# Patient Record
Sex: Female | Born: 1970
Health system: Southern US, Community
[De-identification: ages and names within clinical notes are randomized; demographics above are authoritative.]

## PROBLEM LIST (undated history)

## (undated) DIAGNOSIS — F329 Major depressive disorder, single episode, unspecified: Secondary | ICD-10-CM

## (undated) DIAGNOSIS — N1370 Vesicoureteral-reflux, unspecified: Secondary | ICD-10-CM

## (undated) DIAGNOSIS — M545 Low back pain, unspecified: Secondary | ICD-10-CM

## (undated) DIAGNOSIS — L309 Dermatitis, unspecified: Secondary | ICD-10-CM

## (undated) DIAGNOSIS — F419 Anxiety disorder, unspecified: Secondary | ICD-10-CM

## (undated) DIAGNOSIS — M1990 Unspecified osteoarthritis, unspecified site: Secondary | ICD-10-CM

## (undated) DIAGNOSIS — F32A Depression, unspecified: Secondary | ICD-10-CM

## (undated) DIAGNOSIS — H905 Unspecified sensorineural hearing loss: Secondary | ICD-10-CM

## (undated) HISTORY — DX: Low back pain, unspecified: M54.50

## (undated) HISTORY — DX: Depression, unspecified: F32.A

## (undated) HISTORY — DX: Vesicoureteral-reflux, unspecified: N13.70

## (undated) HISTORY — DX: Major depressive disorder, single episode, unspecified: F32.9

## (undated) HISTORY — DX: Unspecified sensorineural hearing loss: H90.5

## (undated) HISTORY — DX: Dermatitis, unspecified: L30.9

## (undated) HISTORY — DX: Anxiety disorder, unspecified: F41.9

## (undated) HISTORY — DX: Low back pain: M54.5

---

## 1990-05-18 HISTORY — PX: OTHER SURGICAL HISTORY: SHX169

## 2000-03-20 ENCOUNTER — Other Ambulatory Visit: Admission: RE | Admit: 2000-03-20 | Discharge: 2000-03-20 | Payer: Self-pay | Admitting: Gynecology

## 2000-10-17 ENCOUNTER — Other Ambulatory Visit: Admission: RE | Admit: 2000-10-17 | Discharge: 2000-10-17 | Payer: Self-pay | Admitting: Gynecology

## 2000-12-17 ENCOUNTER — Encounter: Admission: RE | Admit: 2000-12-17 | Discharge: 2000-12-17 | Payer: Self-pay | Admitting: Sports Medicine

## 2001-04-23 ENCOUNTER — Other Ambulatory Visit: Admission: RE | Admit: 2001-04-23 | Discharge: 2001-04-23 | Payer: Self-pay | Admitting: Gynecology

## 2001-08-22 ENCOUNTER — Encounter: Admission: RE | Admit: 2001-08-22 | Discharge: 2001-08-22 | Payer: Self-pay | Admitting: Sports Medicine

## 2002-04-16 ENCOUNTER — Other Ambulatory Visit: Admission: RE | Admit: 2002-04-16 | Discharge: 2002-04-16 | Payer: Self-pay | Admitting: Gynecology

## 2002-09-30 ENCOUNTER — Encounter: Payer: Self-pay | Admitting: Obstetrics and Gynecology

## 2002-09-30 ENCOUNTER — Ambulatory Visit (HOSPITAL_COMMUNITY): Admission: RE | Admit: 2002-09-30 | Discharge: 2002-09-30 | Payer: Self-pay | Admitting: Obstetrics and Gynecology

## 2002-12-24 ENCOUNTER — Inpatient Hospital Stay: Admission: AD | Admit: 2002-12-24 | Discharge: 2002-12-25 | Payer: Self-pay | Admitting: Obstetrics and Gynecology

## 2002-12-30 ENCOUNTER — Inpatient Hospital Stay (HOSPITAL_COMMUNITY): Admission: AD | Admit: 2002-12-30 | Discharge: 2002-12-30 | Payer: Self-pay | Admitting: Obstetrics and Gynecology

## 2002-12-31 ENCOUNTER — Inpatient Hospital Stay (HOSPITAL_COMMUNITY): Admission: AD | Admit: 2002-12-31 | Discharge: 2002-12-31 | Payer: Self-pay | Admitting: Obstetrics and Gynecology

## 2003-02-09 ENCOUNTER — Inpatient Hospital Stay (HOSPITAL_COMMUNITY): Admission: AD | Admit: 2003-02-09 | Discharge: 2003-02-13 | Payer: Self-pay | Admitting: Obstetrics and Gynecology

## 2003-02-10 ENCOUNTER — Encounter (INDEPENDENT_AMBULATORY_CARE_PROVIDER_SITE_OTHER): Payer: Self-pay

## 2004-07-27 ENCOUNTER — Other Ambulatory Visit: Admission: RE | Admit: 2004-07-27 | Discharge: 2004-07-27 | Payer: Self-pay | Admitting: Obstetrics and Gynecology

## 2005-06-21 ENCOUNTER — Encounter: Admission: RE | Admit: 2005-06-21 | Discharge: 2005-09-19 | Payer: Self-pay | Admitting: Internal Medicine

## 2005-08-02 ENCOUNTER — Encounter: Admission: RE | Admit: 2005-08-02 | Discharge: 2005-08-02 | Payer: Self-pay | Admitting: Internal Medicine

## 2005-09-04 ENCOUNTER — Other Ambulatory Visit: Admission: RE | Admit: 2005-09-04 | Discharge: 2005-09-04 | Payer: Self-pay | Admitting: Obstetrics and Gynecology

## 2005-11-27 ENCOUNTER — Ambulatory Visit (HOSPITAL_COMMUNITY): Admission: RE | Admit: 2005-11-27 | Discharge: 2005-11-27 | Payer: Self-pay | Admitting: Obstetrics and Gynecology

## 2005-12-20 ENCOUNTER — Ambulatory Visit (HOSPITAL_COMMUNITY): Admission: RE | Admit: 2005-12-20 | Discharge: 2005-12-20 | Payer: Self-pay | Admitting: Obstetrics and Gynecology

## 2005-12-20 ENCOUNTER — Encounter (INDEPENDENT_AMBULATORY_CARE_PROVIDER_SITE_OTHER): Payer: Self-pay | Admitting: Specialist

## 2007-01-25 ENCOUNTER — Inpatient Hospital Stay (HOSPITAL_COMMUNITY): Admission: AD | Admit: 2007-01-25 | Discharge: 2007-01-26 | Payer: Self-pay | Admitting: Gynecology

## 2008-06-23 ENCOUNTER — Inpatient Hospital Stay (HOSPITAL_COMMUNITY): Admission: AD | Admit: 2008-06-23 | Discharge: 2008-06-25 | Payer: Self-pay | Admitting: Obstetrics and Gynecology

## 2009-06-10 ENCOUNTER — Emergency Department (HOSPITAL_COMMUNITY): Admission: EM | Admit: 2009-06-10 | Discharge: 2009-06-10 | Payer: Self-pay | Admitting: Emergency Medicine

## 2010-10-02 LAB — CBC
HCT: 27 % — ABNORMAL LOW (ref 36.0–46.0)
Hemoglobin: 9.3 g/dL — ABNORMAL LOW (ref 12.0–15.0)
MCHC: 34.5 g/dL (ref 30.0–36.0)
RDW: 12.6 % (ref 11.5–15.5)

## 2010-10-30 ENCOUNTER — Inpatient Hospital Stay (INDEPENDENT_AMBULATORY_CARE_PROVIDER_SITE_OTHER)
Admission: RE | Admit: 2010-10-30 | Discharge: 2010-10-30 | Disposition: A | Discharge status: Home / Self Care | Payer: 59 | Source: Ambulatory Visit | Attending: Emergency Medicine | Admitting: Emergency Medicine

## 2010-10-30 DIAGNOSIS — J019 Acute sinusitis, unspecified: Secondary | ICD-10-CM

## 2010-10-31 NOTE — Discharge Summary (Signed)
Sheila Obrien, Sheila Obrien                ACCOUNT NO.:  [id number]   MEDICAL RECORD NO.:  [id number]          PATIENT TYPE:  INP   LOCATION:  9120                          [facility]ILITY:  [facility]   PHYSICIAN:  Sherron Monday, MD        DATE OF BIRTH:  [birth year]   DATE OF ADMISSION:  06/23/2008  DATE OF DISCHARGE:  06/25/2008                               DISCHARGE SUMMARY   ADMITTING DIAGNOSIS:  Intrauterine pregnancy at term.   DISCHARGE DIAGNOSIS:  Intrauterine pregnancy at term, delivered via  spontaneous vaginal delivery.   PROCEDURE:  Term vaginal delivery.   HISTORY OF PRESENT ILLNESS:  A 40 year old G-3, P-1-0-1-1 at 38 weeks in  active labor.  She states she has had good fetal movements, no loss of  fluids, no vaginal bleeding, and contractions since 4.30 a.m. and they  have increased in intensity and frequency since then.  She has had some  nausea and vomiting throughout her pregnancy helped with Zofran.  She  desired a VBAC after discussing in the office risks, benefits, and  alternatives.  This was again revisited, and she wished to proceed, as  she was in very active labor.   PAST MEDICAL HISTORY:  Significant for anxiety and depression and  history of thyroid disease.   PAST SURGICAL HISTORY:  Significant for low transverse caesarean section  as well as an inguinal hernia repair as well as bilateral ureteral  implants.   PAST OB/GYN HISTORY:  G1 was a low transverse caesarean section, 6-pound  infant secondary to fetal stress at 37 weeks.  G2 was a miscarriage.  G3 is the present pregnancy.  She has had a  history of an abnormal Pap smear with colposcopy.  No history of any  sexually transmitted diseases.   MEDICATIONS:  Include Prozac and Wellbutrin as well as prenatal  vitamins.   ALLERGIES:  To PENICILLIN which causes hives.   SOCIAL HISTORY:  She denies alcohol, tobacco, or drug use.  She is  married.   FAMILY HISTORY:  Significant for her father with hypertension and  maternal great uncle with colon cancer and maternal great aunt and uncle  with depression.   PRENATAL LABS:  Hemoglobin 13.1 and platelets 189,000.  AB positive.  Antibody screen negative.  Gonorrhea negative.  Chlamydia negative.  RPR  nonreactive.  Rubella immune.  Cystic fibrosis screen negative.  Hepatitis C surface antigen negative.  HIV negative.  Psoriasis skin  within normal limits. AFB within normal limits.  Group B strep was  negative.  Glucola of 80.   PHYSICAL EXAMINATION ON ADMISSION:  On admission, was afebrile. Vital  signs are stable and a benign exam.  She was in 130s and reactive on  Labor and Delivery with a decel in the MAU are likely secondary to rapid  descent.  In prenatal care an ultrasound at 11 weeks revealed normal  nuchal fold.  Ultrasound on February 09, 2008, at 18 weeks revealed normal  anatomy except an EIF, marginal previa, and appropriate growth.  The  patient was admitted to the Labor and Delivery and was GBS  negative and  needed no prophylaxis.  We expected a rapidly progressing and  delivering.  She had artificial rupture of membranes with clear fluid  without difficulty or complication and rapidly progressed to complete,  complete +2, pushed for three contractions to deliver a viable female  infant at 8:50 a.m. with Apgars of 9 at one minute and 9 at five  minutes, and a weight of 6 pounds 10 ounces.  The baby delivered LOA to  LOT, OB laceration, second-degree perineal repaired with 3-0 Vicryl  bilateral periurethral, they were hemostatic.  Estimated blood loss was  less than 500 mL.   We discussed this with the patient and her husband circumcision for female  infant including the risks, benefits, and alternatives and they wished  to proceed.  Her postpartum course was relatively uncomplicated without  significant complaint.  She had normal lochia, and her pain was well  controlled.  Her hemoglobin was 9.3 after delivery.  She was ready to be   discharged on postpartum day 2, ambulating well, and tolerating diet and  breast feeding her child without difficulty.  She was discharged to home  at this time.  She was given discharge instructions and routine numbers  to call with any questions or problems as well as prescription for  Motrin, Vicodin, and prenatal vitamins.  She voiced understanding of  this.  She is AB positive, rubella immune, and breast feeding.  Her  hemoglobin is 9.3, and we will start Micronor at her 6-week checkup.      Sherron Monday, MD  Electronically Signed     JB/MEDQ  D:  06/25/2008  T:  06/25/2008  Job:  [id number]

## 2010-11-03 NOTE — Op Note (Signed)
NAMESHARDA, Sheila Obrien                ACCOUNT NO.:  [id number]   MEDICAL RECORD NO.:  [id number]          PATIENT TYPE:  AMB   LOCATION:  SDC                           [facility]  [facility]   PHYSICIAN:  Leighton Roach Meisinger, M.D.DATE OF BIRTH:  1971-03-08   DATE OF PROCEDURE:  12/20/2005  DATE OF DISCHARGE:                                 OPERATIVE REPORT   PREOPERATIVE DIAGNOSIS:  Endometrial lesion by hysterosalpingogram.   POSTOPERATIVE DIAGNOSIS:  Endometrial lesion by hysterosalpingogram.   PROCEDURES:  Hysteroscopy with dilatation and curettage.   SURGEON:  Lavina Hamman, M.D.   ANESTHESIA:  General with a mask and paracervical block.   SPECIMENS:  Endometrial curettings sent to Pathology.   ESTIMATED BLOOD LOSS:  Minimal.   COMPLICATIONS:  None.   FINDINGS:  She had a normal endometrial cavity.  Fluid deficit through the  hysteroscope was approximately 10 mL.   PROCEDURE IN DETAIL:  The patient was taken to the operating room and placed  in the dorsal supine position.  General anesthesia was induced and she was  placed in mobile stirrups.  Perineum and vagina were then prepped and draped  in the usual sterile fashion and bladder drained with a red rubber catheter.  A Graves speculum was inserted into the vagina and 2 mL of 2% lidocaine  infiltrated at 12 o'clock.  The anterior lip of the cervix was then grasped  with a single-tooth tenaculum.  Paracervical block was then performed with  16 mL of 2% plain lidocaine.  The uterus then sounded to 8 cm and the cervix  was easily dilated to a size 19 dilator.  The observer hysteroscope was  inserted and fair visualization was achieved.  We did need to increase the  pressure up to 100 mmHg to get adequate uterine distension.  No significant  endometrial lesions were seen.  There was a fair amount of fluffy  endometrium.  Again, with fair visualization, no lesions were seen.  The  hysteroscope was then removed and gentle curettage was  performed with return  of minimal tissue.  The single-tooth tenaculum was then removed and bleeding  controlled with pressure.  All instruments were then removed from the  vagina.  The patient tolerated the procedure well and was taken to the  recovery room in stable condition.      Zenaida Niece, M.D.  Electronically Signed    TDM/MEDQ  D:  12/20/2005  T:  12/20/2005  Job:  [id number]

## 2010-11-03 NOTE — Op Note (Signed)
NAME:  Sheila Obrien, Sheila Obrien                          ACCOUNT NO.:  [id number]   MEDICAL RECORD NO.:  [id number]                   PATIENT TYPE:  INP   LOCATION:  9371                                 [facility]   PHYSICIAN:  Malachi Pro. Ambrose Mantle, M.D.              DATE OF BIRTH:  [birth year]   DATE OF PROCEDURE:  02/09/2003  DATE OF DISCHARGE:                                 OPERATIVE REPORT   HISTORY OF PRESENT ILLNESS:  This 40 year old white married female, para 0,  gravida 1, last period May 26, 2002, [facility] March 04, 2003, by dates  and March 02, 2003, by ultrasound admitted with premature rupture of  membranes.  Blood group and type AB positive, negative antibody, RPR  nonreactive, rubella immune, TSH 1.711, hepatitis B surface antigen  negative, HIV negative, GC and Chlamydia negative, triple screen normal, one  hour Glucola 115, group B strep negative.  Vaginal ultrasound on July 29, 2002, crown rump length of 2.52 cm, nine weeks' one day, [facility] March 02, 2003.  Repeat ultrasound September 30, 2002, average gestational age [redacted]  weeks' 5 days', [facility] February 26, 2003.  On December 24, 2002, the patient had  vaginal bleeding.  She was given Terbutaline.  The cervix fingertip 40% on  December 30, 2002.  On February 04, 2003, the cervix was 2 cm, 60%, vertex at a -  2.  She had spontaneous rupture of membranes on the day of admission at 3  a.m.  She came to maternity admission and rupture of membranes was confirmed  by Gwenyth Bender, RN.  On February 04, 2003, the weight had increased 7.5 pounds  in one week.  On February 08, 2003, the weight had decreased 3 pounds.   PAST MEDICAL HISTORY:  Revealed allergy to PENICILLIN caused hives and  itching.  Illnesses - hypothyroidism as a teenager, anxiety, and depression.   PAST SURGICAL HISTORY:  1. Bilateral ureteral implants, 1990.  2. Left inguinal herniorrhaphy at age six months.   FAMILY HISTORY:  Father with high blood pressure and  depression.  Sister  with migraines.   PHYSICAL EXAMINATION:  VITAL SIGNS:  Normal on admission.  HEENT:  Normal.  HEART:  Normal size and sounds.  No murmurs.  LUNGS:  Clear to P&A.  ABDOMEN:  Soft.  Fundal height 36 cm on February 04, 2003.  Fetal heart tones  were normal.  Cervix 3 cm, 50% by the RN in the MAU.  Rupture of membranes  was confirmed.   The patient was placed on Pitocin.  She reached 4 cm and received an  epidural at approximately 12:45 p.m.  At approximately 12:50 p.m. fetal  heart rate decelerations occurred.  She was given ephedrine, position was  changed, and Pitocin was stopped.  The cervix was 9 cm, 100%, vertex at a 0  station.  The fetal heart rate decelerated again at approximately  1:38 p.m.  It persisted in the 80-90 range for three to four minutes despite of  position change and oxygen.  The cervix was 9+ cm, vertex at a 0 station,  scalp electrode was applied.  Later, the fetal heart rate remained below 100  and we proceeded to the operating room to consider a vaginal instrumental  delivery or a C-  section, depending upon the stability of the heart rate.  We got to the  operating room.  The heart rate was in the 70s, 80s, and 90s, and then the  patient had a seizure and thereafter, the fetal heart rate came above 100.  We proceeded with the C-section.                                               Malachi Pro. Ambrose Mantle, M.D.    TFH/MEDQ  D:  02/09/2003  T:  02/09/2003  Job:  [id number]

## 2010-11-03 NOTE — Op Note (Signed)
NAME:  Sheila Obrien, Sheila Obrien                          ACCOUNT NO.:  [id number]   MEDICAL RECORD NO.:  [id number]                   PATIENT TYPE:  INP   LOCATION:  9371                                 [facility]   PHYSICIAN:  Malachi Pro. Ambrose Mantle, M.D.              DATE OF BIRTH:  [birth year]   DATE OF PROCEDURE:  02/09/2003  DATE OF DISCHARGE:                                 OPERATIVE REPORT   PREOPERATIVE DIAGNOSES:  1. Intrauterine pregnancy at 37 weeks'.  2. Fetal bradycardia.  3. Maternal seizure.   POSTOPERATIVE DIAGNOSES:  1. Intrauterine pregnancy at 37 weeks'.  2. Fetal bradycardia.  3. Maternal seizure.   PROCEDURE:  Low transverse cervical cesarean section.   SURGEON:  Malachi Pro. Ambrose Mantle, M.D.   ANESTHESIA:  General.   DESCRIPTION OF PROCEDURE:  The patient was brought to the operating room  because of persistent bradycardia, the fetal heart rate below 100, usually  in the 80-90 range.  She was taken straight to the operating room and after  being placed on the table the patient developed a seizure.  This was  witnessed by the nurse anesthetist and the anesthesiologist.  I was only  able to see her right hand was in carpal pedal spasm.  According to the  nurse anesthetist, the patient said I don't feel well and began to have her  eyes roll back in her head and began to have tonic movements of her neck and  head.  At this point Raul Del, M.D. felt it was best to induce  general anesthesia.  I had planned to see if the fetal heart rate would  tolerate it to consider a vaginal delivery with either forceps or vacuum but  this was ruled out in this current situation.  The patient was given general  anesthesia and was intubated.  A transverse incision was made and carried in  layers through the skin and subcutaneous tissue and fascia.  The fascia was  separated from the rectus muscle superiorly and inferiorly.  The peritoneum  was opened and stretched.  The lower uterine  segment was exposed.  I made an  incision directly into the uterus, extended the incision laterally with the  bandage scissors, reached down into the pelvis, drew the vertex out through  the incisional opening, suctioned the baby's nose and pharynx with the bulb.  The cord was wrapped around the neck and one of the extremities.  The cord  was clamped.  The infant was given to Dr. Eric Form who was in attendance.  He  assigned the female infant Apgars of 5 at one and 8 at five minutes.  I took  a venous cord pH and the result was 7.27, I think.  It was 7.2+ and I think  it was 7.27.  The placenta was removed.  The inside of the uterus was  inspected and found to be free  of any products of conception.  The uterus,  tubes, and ovaries appeared normal.  The uterine incision was closed with  two running sutures of 0 Vicryl, locking the first layer and nonlocking on  the second layer.  Liberal irrigation confirmed hemostasis.  I did not  reperitonealize the parietal or the visceral peritoneum.  I closed the  rectus muscle with interrupted 0 Vicryl.  I closed the fascia with two  running sutures of 0 Vicryl, the subcutaneous with a running 3-0 Vicryl, and  the skin was closed with automatic staples.  The patient seemed to tolerate  the procedure well.  I spoke to Dr. Cherene Altes.  He does not feel like the  patient needs any kind of neurologic workup.  He thinks that the  seizure came as a result of intravascular lidocaine.  We will further  evaluate this as time goes by.  Blood loss for the procedure was about 1000  mL.  Sponge, instrument, and needle counts were correct and she was returned  to recovery in satisfactory condition.                                               Malachi Pro. Ambrose Mantle, M.D.    TFH/MEDQ  D:  02/09/2003  T:  02/09/2003  Job:  [id number]

## 2010-11-03 NOTE — H&P (Signed)
NAME:  GILBERTO, STRECK NO.:  [id number]   MEDICAL RECORD NO.:  [id number]           PATIENT TYPE:  AMB   LOCATION:                                [facility]   PHYSICIAN:  Zenaida Niece, M.D.     DATE OF BIRTH:   DATE OF ADMISSION:  12/20/2005  DATE OF DISCHARGE:                                HISTORY & PHYSICAL   CHIEF COMPLAINT:  Endometrial lesion.   HISTORY OF PRESENT ILLNESS:  This is a 40 year old white female para 1-0-0-1  whom I saw for an annual exam in March of this year. She was having regular  periods which have been slightly heavier while taking Clomid for secondary  infertility. She took three cycles of Clomid using estrogen and progesterone  also without conception. Her husband had a semen analysis done on June 8  which is normal. The patient had a hysterosalpingogram on June 12th which  revealed a fixed filling defect in the lower uterine segment possibly  representing a polyp or submucosal fibroid. Both Fallopian tubes were  patent. Due to her secondary infertility and an endometrial lesion by  hysterosalpingogram, she is being admitted for hysteroscopy with D&C and  possible resection.   PAST OBSTETRIC HISTORY:  Significant for Cesarean section at term without  complications.   PAST MEDICAL HISTORY:  Significant for anxiety and depression.   PAST SURGICAL HISTORY:  Significant for the above mentioned Cesarean  section. She has also had bilateral ureteral implants via laparotomy for  recurrent UTI and has had a left inguinal herniorrhaphy.   ALLERGIES:  PENICILLIN which causes itching and hives.   CURRENT MEDICATIONS:  Wellbutrin.   FAMILY HISTORY:  No close GYN or colon cancer.   REVIEW OF SYSTEMS:  Normal bowel and bladder function.   SOCIAL HISTORY:  She is married and denies alcohol, tobacco or drug use.   PHYSICAL EXAMINATION:  VITAL SIGNS:  Weight 162 pounds, blood pressure  128/90, pulse 80.  GENERAL:  This is a  well-developed white female in no acute distress.  NECK:  Supple without lymphadenopathy or thyromegaly.  LUNGS:  Clear to auscultation.  HEART:  Regular rate and rhythm without murmur.  ABDOMEN:  Soft, nontender, nondistended without palpable masses. She does  have a well-healed transverse scar.  EXTREMITIES:  Have no edema and are nontender.  PELVIC:  External genitalia have no lesions. On speculum exam the cervix is  normal, and Pap smear has returned to normal. On bimanual exam she has a  small midplane nontender uterus and no adnexal masses.   ASSESSMENT:  Endometrial lesion by hysterosalpingogram during evaluation for  secondary infertility. Options have been discussed with the patient, and she  wishes to proceed with hysteroscopy with evaluation of and resection of this  lesion. Risks of surgery have been discussed.   PLAN:  To admit the patient on the day of surgery for a hysteroscopy with  possible resection or possible D&C.      Zenaida Niece, M.D.  Electronically Signed     TDM/MEDQ  D:  12/18/2005  T:  12/18/2005  Job:  [id number]

## 2010-11-03 NOTE — Discharge Summary (Signed)
NAME:  Sheila Obrien, Sheila Obrien                          ACCOUNT NO.:  [id number]   MEDICAL RECORD NO.:  [id number]                   PATIENT TYPE:  INP   LOCATION:  9120                                 [facility]   PHYSICIAN:  Malachi Pro. Ambrose Mantle, M.D.              DATE OF BIRTH:  21-Aug-1970   DATE OF ADMISSION:  02/09/2003  DATE OF DISCHARGE:  02/13/2003                                 DISCHARGE SUMMARY   DISCHARGE SUMMARY:  A 40 year old, white, married female, para 0, gravida 1,  EDC March 04, 2003 by dates and March 02, 2003 by ultrasound,  admitted with premature rupture of the membranes.  Blood group and type AB  positive.  Negative antibody.  RPR nonreactive.  Rubella immune.  Hepatitis  B surface antigen negative.  HIV negative.  GC and Chlamydia negative.  Triple screen normal.  One hour Glucola 115.  Group B strep negative.  The  patient's prenatal course is outlined in the history and physical.  She came  to the hospital with premature rupture of the membranes.  She reached 4-cm  dilatation and received an epidural at approximately 12:45 p.m. at 12:50  p.m. fetal heart decelerations were noted.  She was given ephedrine,  position change and Pitocin was stopped.  By 1 p.m. the cervix was 9-cm.  The fetal heart rate decelerated again at 1:30 p.m. or so.  It persisted in  the 80-90 range for 3-4 minutes in spite of position change and oxygen.  The  cervix was still 9+-cm, vertex at a 0 station.  Scalp electrode was applied.  Subsequently the fetal heart rate remained below 100.  I wanted to go to the  operating room to manage the final part of labor there and be ready for a C-  section.  She was taken to the operating room and was given a bolus into the  epidural and almost immediately began to have seizure.  Therefore, she was  placed under general anesthesia, intubated and a low-transverse cervical C-  section was done with delivery of a 6 pound 0 ounce female infant.  Apgar's  of  5 at one and 8 at five minutes.   Postpartum the patient did quite well.  She had no more seizure activity.  According to the anesthesiologist and the anesthetist, the seizure came as a  result of Xylocaine injected through the epidural and it must have been  intravascular.  PIH labs were drawn and the patient was treated with  magnesium sulfate for close to 24 hours but never showed any signs of pre-  eclampsia and the labs were normal.  On the fourth post-op day she was  ambulating well, voiding well without difficulty, passing flatus.  Her  abdomen was soft and nontender.  Staples removed.  Strips were applied.  And, she was discharged.   LABORATORY DATA:  Urinalysis was negative.  Hemoglobin 12, hematocrit  34.7,  white count 22,000, platelet count 175,000.  PIH labs are not in the chart,  but they were normal.   FINAL DIAGNOSES:  1. Intrauterine pregnancy at 37 weeks.  2. Premature rupture of the membranes.  3. Maternal seizure.  4. Fetal stress manifested by fetal bradycardia.   OPERATION:  Low-transverse cervical C-section.   FINAL CONDITION:  Improved.   INSTRUCTIONS:  1. Include our regular discharge instruction booklet.  2.     Patient is given a prescription for Percocet 5/325, 20 tablets, one every     four to six hours as needed for pain.  3. She also is going to continue her Wellbutrin and Lexapro.  4. Return to the office in ten days for followup examination.                                               Malachi Pro. Ambrose Mantle, M.D.    TFH/MEDQ  D:  02/13/2003  T:  02/13/2003  Job:  [id number]

## 2011-12-04 ENCOUNTER — Encounter: Payer: Self-pay | Admitting: Sports Medicine

## 2011-12-04 ENCOUNTER — Ambulatory Visit (INDEPENDENT_AMBULATORY_CARE_PROVIDER_SITE_OTHER): Payer: 59 | Admitting: Sports Medicine

## 2011-12-04 VITALS — BP 108/71 | HR 61 | Ht 64.0 in | Wt 149.0 lb

## 2011-12-04 DIAGNOSIS — M7741 Metatarsalgia, right foot: Secondary | ICD-10-CM

## 2011-12-04 DIAGNOSIS — M79671 Pain in right foot: Secondary | ICD-10-CM

## 2011-12-04 DIAGNOSIS — M7742 Metatarsalgia, left foot: Secondary | ICD-10-CM | POA: Insufficient documentation

## 2011-12-04 DIAGNOSIS — M79609 Pain in unspecified limb: Secondary | ICD-10-CM

## 2011-12-04 DIAGNOSIS — M7750 Other enthesopathy of unspecified foot: Secondary | ICD-10-CM

## 2011-12-04 NOTE — Assessment & Plan Note (Signed)
She was placed in a sports insole and given an arch strap to use for standing or walking. We added some heel lifts bilaterally to help compensate for the cavus foot  She should do some standard plantar fascial stretches and exercises along with icing at the end of the day.  After placing this she was able to run without limp and showed good running form.  I would like to recheck this in 6 weeks and repeat the ultrasound to see if the fluid has resolved.

## 2011-12-04 NOTE — Progress Notes (Signed)
  Subjective:    Patient ID: Sheila Obrien, female    DOB: [birth year], 41 y.o.   MRN: [id number]  HPI  Pt presents to clinic for evaluation of bilat heel pain R > L that started 6 weeks ago after doing a trail run.  Pain is worse after running and the next day or if she does not wear shoes.   Was last seen here for metatarsalgia about 10 years and has been using metatarsal cookies since then which have helped her return to running in the past year.   There is no prolonged morning pain. She does feel some stiffness and pain when she first starts walking.  Review of Systems     Objective:   Physical Exam nad Cavus foot bilat Splayed toes 2-3 on lt Extension contracture of 5th toe bilat Mild morton's callus on rt, morton's callus on lt Subluxation of rt 5th MT with dorsal plantar flexion  Muscular skeletal ultrasound There is mild thickening of the plantar fascia on the right at the insertion into the heel. Measurement here is 0.54 cm versus 0.46 on the left. A bigger change is seen 2 cm distal to the insertion where there is a hypoechoic area between the bellies of her flexor muscles. This is near the area where vessels penetrate the fascia. It does not appear to be a true ganglion but suggests a probable split in the fascial lining.       Assessment & Plan:

## 2011-12-04 NOTE — Assessment & Plan Note (Signed)
Metatarsal cookies are placed on both feet.  She was comfortable with these. I would recommend continued to use those in her work and exercise shoes

## 2011-12-04 NOTE — Patient Instructions (Addendum)
Use arch strap when you are running, exercising, or doing a lot of walking  Use sports insoles with activity   Do suggested exercises daily  Ok to increase running every 2 weeks as desired if your heels are doing well    Please follow up in 6 weeks  Thank you for seeing [place] today!

## 2011-12-31 ENCOUNTER — Encounter: Payer: Self-pay | Admitting: *Deleted

## 2012-01-15 ENCOUNTER — Ambulatory Visit: Payer: 59 | Admitting: Sports Medicine

## 2012-02-13 ENCOUNTER — Encounter: Payer: Self-pay | Admitting: Family Medicine

## 2012-02-13 ENCOUNTER — Ambulatory Visit (INDEPENDENT_AMBULATORY_CARE_PROVIDER_SITE_OTHER): Payer: 59 | Admitting: Family Medicine

## 2012-02-13 VITALS — BP 130/80 | HR 76 | Ht 64.0 in | Wt 153.0 lb

## 2012-02-13 DIAGNOSIS — Z13220 Encounter for screening for lipoid disorders: Secondary | ICD-10-CM

## 2012-02-13 DIAGNOSIS — Z0000 Encounter for general adult medical examination without abnormal findings: Secondary | ICD-10-CM

## 2012-02-13 DIAGNOSIS — N6019 Diffuse cystic mastopathy of unspecified breast: Secondary | ICD-10-CM

## 2012-02-13 DIAGNOSIS — L259 Unspecified contact dermatitis, unspecified cause: Secondary | ICD-10-CM

## 2012-02-13 DIAGNOSIS — L309 Dermatitis, unspecified: Secondary | ICD-10-CM

## 2012-02-13 DIAGNOSIS — R5381 Other malaise: Secondary | ICD-10-CM

## 2012-02-13 LAB — POCT URINALYSIS DIPSTICK
Blood, UA: NEGATIVE
Glucose, UA: NEGATIVE
Spec Grav, UA: 1.01
Urobilinogen, UA: NEGATIVE
pH, UA: 5

## 2012-02-13 MED ORDER — TRIAMCINOLONE ACETONIDE 0.1 % EX CREA: TOPICAL_CREAM | Freq: Two times a day (BID) | CUTANEOUS | Status: AC

## 2012-02-13 NOTE — Progress Notes (Signed)
Chief Complaint  Patient presents with  . Annual Exam    new patient fasting annual CPE, w/o pap. Sees Dr.Meisinger, last pap 12/2010.  Has fibrocystic breasts would like for you to check her left breast today, feels soreness. Also would like to have thyroid checked.   Sheila Obrien is a 41 y.o. female who presents for a complete physical.  She has the following concerns:  L breast intermittently gets sore; has known fibrocystic changes.  Noticing more pain in the last 5-6 months, intermittent soreness.  Currently she is towards end of her cycle, due again next week.  Drinks 2 caffeinated beverages/day.  Mammogram is up to date. She would like breast examined today due to discomfort (and the fact that her GYN is a few months away).  Earlier in the summer she had absolutely no energy.  Lasted about 3 weeks, better now.  Would like blood tests done  Eczema flaring currently, and she is out of steroid creams.  Flaring on anterior arms, behind knees.  Health Maintenance:  There is no immunization history on file for this patient. She believes tetanus is UTD through employee health--unsure if Td or Tdap She gets flu shots annually.  Last Pap smear: <1 year Last mammogram: approx February through [facility] Last colonoscopy: never Last DEXA: never Dentist: twice yearly Ophtho: yearly, wears contacts Exercise: limited recently due to foot pain (saw Dr. Darrick Penna in June, got orthotics).  She was exercising/running 4 days/week.  Past Medical History  Diagnosis Date  . Anxiety     managed by Dr. Evelene Croon  . Depression     managed by Dr. Evelene Croon  . Ureteral reflux     s/p surgery  . Low back pain     intermittent problems with SI joint    Past Surgical History  Procedure Date  . Bilateral ureteral reimplantation 05/1990    Dr.Humphries  . Cesarean section 2004    History   Social History  . Marital Status: Married    Spouse Name: N/A    Number of Children: 2  . Years of Education: N/A    Occupational History  . 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 Smoking status: Former Smoker    Quit date: 06/18/1996  . Smokeless tobacco: Never Used  . Alcohol Use: Yes     2-3 drinks per month.  . Drug Use: No  . Sexually Active: Yes -- Female partner(s)    Birth Control/ Protection: Pill   Other Topics Concern  . Not on file   Social History Narrative   Lives with husband, 2 sons, 1 dog    Family History  Problem Relation Age of Onset  . Alcohol abuse Father   . Hypertension Father   . Depression Father   . Glaucoma Father   . Migraines Sister   . Migraines Son   . Diabetes Neg Hx   . Heart disease Neg Hx   . Cancer Other     colon cancer    Current outpatient prescriptions:buPROPion (WELLBUTRIN XL) 150 MG 24 hr tablet, Take 150 mg by mouth daily., Disp: , Rfl: ;  cetirizine (ZYRTEC) 10 MG tablet, Take 10 mg by mouth daily., Disp: , Rfl: ;  FLUoxetine (PROZAC) 20 MG capsule, Take 20 mg by mouth daily., Disp: , Rfl:  Also on OCP's--can't recall the name  Allergies  Allergen Reactions  . Penicillins Hives   ROS:  The patient denies anorexia, fever, weight changes, headaches,  vision changes, decreased hearing, ear pain, sore throat, chest pain, palpitations, dizziness, syncope, dyspnea on exertion, cough, swelling, nausea, vomiting, diarrhea, constipation, abdominal pain, melena, hematochezia, indigestion/heartburn, hematuria, incontinence, dysuria, irregular menstrual cycles, vaginal discharge, odor or itch, genital lesions, joint pains, numbness, tingling, weakness, tremor, suspicious skin lesions, depression, anxiety, abnormal bleeding/bruising, or enlarged lymph nodes. Some gum recession--needs to see periodontist Lost 23# last year, regained 12# since decrease in exercise, vacation. Left breast pain as per HPI Chronic hearing loss L ear since childhood.  No tinnitus. Bloating/gas--improved after cutting back on apple intake eczma  flare  PHYSICAL EXAM: BP 130/80  Pulse 76  Ht 5\' 4"  (1.626 m)  Wt 153 lb (69.4 kg)  BMI 26.26 kg/m2  LMP 01/15/2012  General Appearance:    Alert, cooperative, no distress, appears stated age  Head:    Normocephalic, without obvious abnormality, atraumatic  Eyes:    PERRL, conjunctiva/corneas clear, EOM's intact, fundi    benign  Ears:    Normal TM's and external ear canals  Nose:   Nares normal, mucosa normal, no drainage or sinus   tenderness  Throat:   Lips, mucosa, and tongue normal; teeth and gums normal  Neck:   Supple, no lymphadenopathy;  thyroid:  no   enlargement/tenderness/nodules; no carotid   bruit or JVD  Back:    Spine nontender, no curvature, ROM normal, no CVA     tenderness  Lungs:     Clear to auscultation bilaterally without wheezes, rales or     ronchi; respirations unlabored  Chest Wall:    No tenderness or deformity   Heart:    Regular rate and rhythm, S1 and S2 normal, no murmur, rub   or gallop  Breast Exam:    Left breast with diffuse fibrocystic changes, especially laterally (UOQ and laterally, into lower portion of breast also).  No discrete mass; mildly tender.  No axillary lymphadenopathy, nipple discharge, skin dimpling or nipple inversion.  Abdomen:     Soft, non-tender, nondistended, normoactive bowel sounds,    no masses, no hepatosplenomegaly  Genitalia:    Deferred to GYN     Extremities:   No clubbing, cyanosis or edema  Pulses:   2+ and symmetric all extremities  Skin:   Skin color, texture, turgor normal, no lesions; eczematous changes noted R anterior forearm (dry patch)  Lymph nodes:   Cervical, supraclavicular, and axillary nodes normal  Neurologic:   CNII-XII intact, normal strength, sensation and gait; reflexes 2+ and symmetric throughout          Psych:   Normal mood, affect, hygiene and grooming.    ASSESSMENT/PLAN:  1. Routine general medical examination at a health care facility  Visual acuity screening, POCT Urinalysis Dipstick   2. Screening for lipoid disorders  Lipid panel  3. Other malaise and fatigue  Comprehensive metabolic panel, CBC with Differential, Vitamin D 25 hydroxy, TSH  4. Eczema  triamcinolone cream (KENALOG) 0.1 %  5. Fibrocystic breast      Eczema--TAC 0.1% BID prn, keep well moisturized  Discussed monthly self breast exams and yearly mammograms after the age of 65; at least 30 minutes of aerobic activity at least 5 days/week; proper sunscreen use reviewed; healthy diet, including goals of calcium and vitamin D intake and alcohol recommendations (less than or equal to 1 drink/day) reviewed; regular seatbelt use; changing batteries in smoke detectors.  Immunization recommendations discussed.  Colonoscopy recommendations reviewed--age 52  Check with employee health re: tetanus date and type (Td  vs TdaP).  If hasn't had TdaP, it is recommended.

## 2012-02-13 NOTE — Patient Instructions (Addendum)
HEALTH MAINTENANCE RECOMMENDATIONS:  It is recommended that you get at least 30 minutes of aerobic exercise at least 5 days/week (for weight loss, you may need as much as 60-90 minutes). This can be any activity that gets your heart rate up. This can be divided in 10-15 minute intervals if needed, but try and build up your endurance at least once a week.  Weight bearing exercise is also recommended twice weekly.  Eat a healthy diet with lots of vegetables, fruits and fiber.  "Colorful" foods have a lot of vitamins (ie green vegetables, tomatoes, red peppers, etc).  Limit sweet tea, regular sodas and alcoholic beverages, all of which has a lot of calories and sugar.  Up to 1 alcoholic drink daily may be beneficial for women (unless trying to lose weight, watch sugars).  Drink a lot of water.  Calcium recommendations are 1200-1500 mg daily (1500 mg for postmenopausal women or women without ovaries), and vitamin D 1000 IU daily.  This should be obtained from diet and/or supplements (vitamins), and calcium should not be taken all at once, but in divided doses.  Monthly self breast exams and yearly mammograms for women over the age of 4 is recommended.  Sunscreen of at least SPF 30 should be used on all sun-exposed parts of the skin when outside between the hours of 10 am and 4 pm (not just when at beach or pool, but even with exercise, golf, tennis, and yard work!)  Use a sunscreen that says "broad spectrum" so it covers both UVA and UVB rays, and make sure to reapply every 1-2 hours.  Remember to change the batteries in your smoke detectors when changing your clock times in the spring and fall.  Use your seat belt every time you are in a car, and please drive safely and not be distracted with cell phones and texting while driving.   Check with employee health re: tetanus date and type (Td vs TdaP).  If hasn't had TdaP, it is recommended.  Cutting back on caffeine (and sometimes vitamin E) can help  with fibrocystic pain.  Make sure to recheck breast after your next period starts.

## 2012-02-14 ENCOUNTER — Encounter: Payer: Self-pay | Admitting: Family Medicine

## 2012-02-14 LAB — LIPID PANEL
LDL Cholesterol: 112 mg/dL — ABNORMAL HIGH (ref 0–99)
Triglycerides: 91 mg/dL (ref <150)
VLDL: 18 mg/dL (ref 0–40)

## 2012-02-14 LAB — CBC WITH DIFFERENTIAL/PLATELET
Basophils Absolute: 0 10*3/uL (ref 0.0–0.1)
Basophils Relative: 1 % (ref 0–1)
Eosinophils Absolute: 0.1 10*3/uL (ref 0.0–0.7)
Eosinophils Relative: 1 % (ref 0–5)
HCT: 39.6 % (ref 36.0–46.0)
MCHC: 32.8 g/dL (ref 30.0–36.0)
MCV: 92.7 fL (ref 78.0–100.0)
Monocytes Absolute: 0.4 10*3/uL (ref 0.1–1.0)
RDW: 13.2 % (ref 11.5–15.5)

## 2012-02-14 LAB — COMPREHENSIVE METABOLIC PANEL
ALT: 14 U/L (ref 0–35)
AST: 16 U/L (ref 0–37)
Calcium: 9.2 mg/dL (ref 8.4–10.5)
Chloride: 102 mEq/L (ref 96–112)
Creat: 0.8 mg/dL (ref 0.50–1.10)
Total Bilirubin: 0.5 mg/dL (ref 0.3–1.2)

## 2012-02-19 ENCOUNTER — Ambulatory Visit (INDEPENDENT_AMBULATORY_CARE_PROVIDER_SITE_OTHER): Payer: 59 | Admitting: Sports Medicine

## 2012-02-19 VITALS — BP 110/60 | Ht 64.0 in | Wt 155.0 lb

## 2012-02-19 DIAGNOSIS — M79671 Pain in right foot: Secondary | ICD-10-CM

## 2012-02-19 DIAGNOSIS — M25569 Pain in unspecified knee: Secondary | ICD-10-CM | POA: Insufficient documentation

## 2012-02-19 DIAGNOSIS — M79609 Pain in unspecified limb: Secondary | ICD-10-CM

## 2012-02-19 NOTE — Assessment & Plan Note (Addendum)
Tendonitis of the lateral aspect of the Patellar insertion.  Pt given VMO strengthening exercises and glute medius exercises.  Ice to knee prn QUAD SETS, Straight leg raises and knee extension provided If not improved consider referral to PT for core program  Compression and icing over tibial tubercle and she has a superfical bursitis

## 2012-02-19 NOTE — Progress Notes (Signed)
  [facility]  Patient name: Sheila Obrien MRN [id number]  Date of birth: Mar 14, 1971  CC & HPI:  Sheila Obrien is a 41 y.o. female presenting today for follow up of R foot pain and evaluation of L knee pain.  Reports her R foot pain has significantly improved and she will only on occasion have symptoms.  She reports that she has not been exercising as much lately as she normally does due to schedule constraints and is concerned that when she returns to her normal activity level the pain will return.  Also reports L sided knee pain that has been long standing.  Located on the lateral aspect of the tibial tubercle.  Pain with palpation.    ROS:  No weakness, no limitation in daily activities  Pertinent History Reviewed:  Medical & Surgical Hx:  Reviewed: Significant for Fibrocystic breast changes, depression Medications: Reviewed & Updated - see associated section Social History: Reviewed - Significant for non smoker  Objective Findings:  Vitals:  Filed Vitals:   02/19/12 1443  BP: 110/60    Foot Exam: R foot: TTP over medial aspect of plantar surface worse approximately 2 cm from the calcaneous over the plantar fascia.  Full ankle ROM,  Ankle strength 5+/5 in all 4 planes.  No TTP over anterior joint line.   US FINDINGS: thickening of the Plantar fascia ~3-4 cm distal to the insertion to 0.35cm with evidence of fibromatous changes along the plantar fascia.  Prior cystic structure improved but remains present with fluctuant blood flow with compression  Knee Exam:  L knee: TTP over the lateral aspect of the tibial tuberosity.  Palpable dimpling over area of soreness.  + Patellar Grind on L; negative on R.  No joint line tenderness, full active ROM of knee; negative lachman's, negative A/P drawer; negative varus/valgus strain.  No TTP over pes anserine.  L VMO less well developed than R.    Hip ABDUCTORS: 4-/5 Bilaterally with L worse than R US findings:  Calcific changes over  the medial insertion of the patellar tendon  Hypoechoic changes appreciated around the tendon at this area and lateral to tibial tubercle;  Soft tissue hypo echoic change   Assessment & Plan:

## 2012-02-19 NOTE — Patient Instructions (Addendum)
It was nice to meet you today.  You have a condition called Plantar Fibromatosis.  Please continue using your compression sleeve on your feet and continue with your exercises.  Remember to massage your feet.  Please follow up in 3-4 months.

## 2012-02-19 NOTE — Assessment & Plan Note (Signed)
Likely Plantar Fibromatosis with associated plantar fasciitis.  Cystic component seen during last exam improved; ?vascularity vs hematoma vs flexor sheath separation.   Continue compression sleeve, eccentric strengthening, gentle massage and plantar support. Discussed with pt association of Plantar Fibromatosis and dupuytren's contractures  Follow up in 2-3 months

## 2012-09-30 LAB — HM MAMMOGRAPHY: HM Mammogram: NEGATIVE

## 2012-10-02 ENCOUNTER — Encounter: Payer: Self-pay | Admitting: Internal Medicine

## 2013-07-16 ENCOUNTER — Encounter: Payer: Self-pay | Admitting: Family Medicine

## 2013-07-16 ENCOUNTER — Ambulatory Visit (INDEPENDENT_AMBULATORY_CARE_PROVIDER_SITE_OTHER): Payer: 59 | Admitting: Family Medicine

## 2013-07-16 VITALS — BP 110/70 | HR 60 | Ht 64.0 in | Wt 170.0 lb

## 2013-07-16 DIAGNOSIS — R5383 Other fatigue: Secondary | ICD-10-CM

## 2013-07-16 DIAGNOSIS — M25569 Pain in unspecified knee: Secondary | ICD-10-CM

## 2013-07-16 DIAGNOSIS — R635 Abnormal weight gain: Secondary | ICD-10-CM

## 2013-07-16 DIAGNOSIS — R5381 Other malaise: Secondary | ICD-10-CM

## 2013-07-16 DIAGNOSIS — Z0000 Encounter for general adult medical examination without abnormal findings: Secondary | ICD-10-CM

## 2013-07-16 DIAGNOSIS — E7800 Pure hypercholesterolemia, unspecified: Secondary | ICD-10-CM

## 2013-07-16 LAB — POCT URINALYSIS DIPSTICK
Bilirubin, UA: NEGATIVE
GLUCOSE UA: NEGATIVE
Ketones, UA: NEGATIVE
Nitrite, UA: NEGATIVE
PH UA: 5
PROTEIN UA: NEGATIVE
SPEC GRAV UA: 1.015
UROBILINOGEN UA: NEGATIVE

## 2013-07-16 NOTE — Patient Instructions (Addendum)
  HEALTH MAINTENANCE RECOMMENDATIONS:  It is recommended that you get at least 30 minutes of aerobic exercise at least 5 days/week (for weight loss, you may need as much as 60-90 minutes). This can be any activity that gets your heart rate up. This can be divided in 10-15 minute intervals if needed, but try and build up your endurance at least once a week.  Weight bearing exercise is also recommended twice weekly.  Eat a healthy diet with lots of vegetables, fruits and fiber.  "Colorful" foods have a lot of vitamins (ie green vegetables, tomatoes, red peppers, etc).  Limit sweet tea, regular sodas and alcoholic beverages, all of which has a lot of calories and sugar.  Up to 1 alcoholic drink daily may be beneficial for women (unless trying to lose weight, watch sugars).  Drink a lot of water.  Calcium recommendations are 1200-1500 mg daily (1500 mg for postmenopausal women or women without ovaries), and vitamin D 1000 IU daily.  This should be obtained from diet and/or supplements (vitamins), and calcium should not be taken all at once, but in divided doses.  Monthly self breast exams and yearly mammograms for women over the age of 3 is recommended.  Sunscreen of at least SPF 30 should be used on all sun-exposed parts of the skin when outside between the hours of 10 am and 4 pm (not just when at beach or pool, but even with exercise, golf, tennis, and yard work!)  Use a sunscreen that says "broad spectrum" so it covers both UVA and UVB rays, and make sure to reapply every 1-2 hours.  Remember to change the batteries in your smoke detectors when changing your clock times in the spring and fall.  Use your seat belt every time you are in a car, and please drive safely and not be distracted with cell phones and texting while driving.  Try and check with employee health re: date and type of tetanus, and let us know so we can enter into your chart  Consider course of ibuprofen or aleve for 7-10 days,  along with stretches (IT band).  Consider PT or ART through chiropractor (ie [facility] Hardin [facility] at [facility]) or back to Dr. Oneida Alar if ongoing problems.  Ibuprofen 800mg  three times daily with food (cut down dose if bothering your stomach) OR Aleve, 2 tablets twice daily

## 2013-07-16 NOTE — Progress Notes (Signed)
Chief Complaint  Patient presents with  . Annual Exam    nonfasting annual exam without pap. Sees Dr.Meisinger and is UTD. UA shows trace leuks and blood, patient is not having any symptoms.Does think she is going through "pre-menopause." Is having hot flashes and hair loss. Is having some left knee pain since before Christmas. Takes OCP's but is unsure of name.    Sheila Obrien is a 43 y.o. female who presents for a complete physical.  She has the following concerns:  Weight gain--has used Weight Watchers successfully in the past, and plans to restart.  Her weight was a scary number today, and she reports being motivated to get back on track. She also recently downloaded MyFitnessPal app  Hair loss in May through August.  Has been improving.  She also had hot flashes at that time.  She has been on OCP's, to help with her periods (cramping, shorter). She is wondering about her hormones, perimenopause.  Hot flashes have improved--was getting them even on the OCP's.  Denies any hot flashes during placebo week.  L knee pain laterally since before Christmas. No known injury, fall.  Denies swelling. Increased pain when she started running again.  Prior knee pain was in a different location.  Immunization History  Administered Date(s) Administered  . Influenza Split 03/18/2013   She believes tetanus is UTD through employee health--unsure if Td or Tdap  She gets flu shots annually.  Last Pap smear: due now (sees Dr. Willis Modena) Last mammogram: 09/2012  Last colonoscopy: never  Last DEXA: never  Dentist: twice yearly  Ophtho: yearly, wears contacts Exercise: 1-2x/week  Past Medical History  Diagnosis Date  . Anxiety     managed by Dr. Toy Care  . Depression     managed by Dr. Toy Care  . Ureteral reflux     s/p surgery  . Low back pain     intermittent problems with SI joint  . Eczema     Past Surgical History  Procedure Laterality Date  . Bilateral ureteral reimplantation  05/1990     Dr.Humphries  . Cesarean section  2004    History   Social History  . Marital Status: Married    Spouse Name: N/A    Number of Children: 2  . Years of Education: N/A   Occupational History  . 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 Smoking status: Former Smoker    Quit date: 06/18/1996  . Smokeless tobacco: Never Used  . Alcohol Use: Yes     Comment: 2-3 drinks per month.  . Drug Use: No  . Sexual Activity: Yes    Partners: Male    Birth Control/ Protection: Pill   Other Topics Concern  . Not on file   Social History Narrative   Lives with husband, 2 sons, 1 dog    Family History  Problem Relation Age of Onset  . Alcohol abuse Father   . Hypertension Father   . Depression Father   . Glaucoma Father   . Migraines Sister   . Migraines Son   . Diabetes Neg Hx   . Heart disease Neg Hx   . Cancer Other     colon cancer    Current outpatient prescriptions:buPROPion (WELLBUTRIN XL) 150 MG 24 hr tablet, Take 150 mg by mouth daily., Disp: , Rfl: ;  escitalopram (LEXAPRO) 20 MG tablet, Take 20 mg by mouth daily., Disp: , Rfl: ;  cetirizine (ZYRTEC) 10 MG tablet, Take  10 mg by mouth daily., Disp: , Rfl:   Allergies  Allergen Reactions  . Penicillins Hives   ROS: The patient denies anorexia, fever, headaches, vision changes, decreased hearing, ear pain, sore throat, chest pain, palpitations, dizziness, syncope, dyspnea on exertion, cough, swelling, nausea, vomiting, diarrhea, constipation, abdominal pain, melena, hematochezia, indigestion/heartburn, hematuria, incontinence, dysuria, irregular menstrual cycles, vaginal discharge, or odor, genital lesions, numbness, tingling, weakness, tremor, suspicious skin lesions, depression, anxiety, abnormal bleeding/bruising, or enlarged lymph nodes.  Chronic hearing loss L ear since childhood. No tinnitus.  Bloating/gas, intermittent, unchanged. eczma on hands, intermittent. Very slight vaginal itch--to be  evaluated by GYN when flaring. 15 pound weight gain in the last year and a half.  PHYSICAL EXAM:  BP 110/70  Pulse 60  Ht 5\' 4"  (1.626 m)  Wt 170 lb (77.111 kg)  BMI 29.17 kg/m2  LMP 06/29/2013 General Appearance:  Alert, cooperative, no distress, appears stated age   Head:  Normocephalic, without obvious abnormality, atraumatic   Eyes:  PERRL, conjunctiva/corneas clear, EOM's intact, fundi  benign   Ears:  Normal TM's and external ear canals   Nose:  Nares normal, mucosa normal, no drainage or sinus tenderness   Throat:  Lips, mucosa, and tongue normal; teeth and gums normal   Neck:  Supple, no lymphadenopathy; thyroid: no enlargement/tenderness/nodules; no carotid  bruit or JVD   Back:  Spine nontender, no curvature, ROM normal, no CVA tenderness   Lungs:  Clear to auscultation bilaterally without wheezes, rales or ronchi; respirations unlabored   Chest Wall:  No tenderness or deformity   Heart:  Regular rate and rhythm, S1 and S2 normal, no murmur, rub  or gallop   Breast Exam:  Deferred to GYN  Abdomen:  Soft, non-tender, nondistended, normoactive bowel sounds,  no masses, no hepatosplenomegaly   Genitalia:  Deferred to GYN      Extremities:  No clubbing, cyanosis or edema.  L knee:  No effusion, warmth.  No joint line tenderness. Area of pain is lateral knee.  No pain with ITB stretching.  Normal Lachman, McMurray, drawer.  Negative flick test.  No evidence of internal derangement  Pulses:  2+ and symmetric all extremities   Skin:  Skin color, texture, turgor normal, no lesions; +dry skin  Lymph nodes:  Cervical, supraclavicular, and axillary nodes normal   Neurologic:  CNII-XII intact, normal strength, sensation and gait; reflexes 2+ and symmetric throughout          Psych: Normal mood, affect, hygiene and grooming.   ASSESSMENT/PLAN:  Routine general medical examination at a health care facility - Plan: Visual acuity screening, POCT Urinalysis Dipstick, Glucose,  random  Pure hypercholesterolemia - Plan: Lipid panel  Weight gain - Plan: TSH  Other malaise and fatigue - Plan: TSH  Knee pain - left lateral.  suspect ITB.  shown stretches.  heat/ice/NSAIDs.  consider ART with chiro vs PT if not improving.     Discussed monthly self breast exams and yearly mammograms after the age of 71; at least 30 minutes of aerobic activity at least 5 days/week; proper sunscreen use reviewed; healthy diet, including goals of calcium and vitamin D intake and alcohol recommendations (less than or equal to 1 drink/day) reviewed; regular seatbelt use; changing batteries in smoke detectors.  Immunization recommendations discussed.  Try and check with employee health re: date and type of tetanus, and let us know so we can enter into your chart.   Colonoscopy recommendations reviewed--age 37  Glucose, TSH, lipid  Counseled  extensively re: diet, exercise, portion control.  Encouraged her to restart Weight Watchers

## 2013-07-23 ENCOUNTER — Other Ambulatory Visit: Payer: 59

## 2013-07-23 DIAGNOSIS — E7800 Pure hypercholesterolemia, unspecified: Secondary | ICD-10-CM

## 2013-07-23 DIAGNOSIS — R5383 Other fatigue: Secondary | ICD-10-CM

## 2013-07-23 DIAGNOSIS — R635 Abnormal weight gain: Secondary | ICD-10-CM

## 2013-07-23 DIAGNOSIS — R5381 Other malaise: Secondary | ICD-10-CM

## 2013-07-23 DIAGNOSIS — Z0000 Encounter for general adult medical examination without abnormal findings: Secondary | ICD-10-CM

## 2013-07-23 LAB — LIPID PANEL
CHOL/HDL RATIO: 2.8 ratio
CHOLESTEROL: 203 mg/dL — AB (ref 0–200)
HDL: 72 mg/dL (ref >39)
LDL Cholesterol: 111 mg/dL — ABNORMAL HIGH (ref 0–99)
Triglycerides: 99 mg/dL (ref <150)
VLDL: 20 mg/dL (ref 0–40)

## 2013-07-23 LAB — GLUCOSE, RANDOM: Glucose, Bld: 75 mg/dL (ref 70–99)

## 2013-07-23 LAB — TSH: TSH: 2.465 u[IU]/mL (ref 0.350–4.500)

## 2014-03-15 ENCOUNTER — Telehealth: Payer: Self-pay | Admitting: *Deleted

## 2014-03-15 NOTE — Telephone Encounter (Signed)
Try Flonase first (and make sure using properly--2 gentle sniffs once daily).  If after 1-2 weeks of using, she is still having problems, then schedule appt.  I prefer OV to discuss meds.  I find that flonase is better for head congestion, ear plugging, etc than Singulair is

## 2014-03-15 NOTE — Telephone Encounter (Signed)
Left message informing patient of Dr.Knapp's recommendations.

## 2014-03-15 NOTE — Telephone Encounter (Signed)
error 

## 2014-03-15 NOTE — Telephone Encounter (Signed)
Patient called and stated that she is really having a tough time with allergies this season. She has tried OTC zyrtec, claritin and allegra without any relief. She wanted to know before she scheduled an appt (I offered her one) if Singulair would be something that you could rx for her-she has heard that this works well from co-workers. She has not tried any nasal sprays ( I asked her). Please let me know and I will call her back. Thanks.

## 2014-04-19 ENCOUNTER — Encounter: Payer: Self-pay | Admitting: Family Medicine

## 2014-04-21 ENCOUNTER — Ambulatory Visit (INDEPENDENT_AMBULATORY_CARE_PROVIDER_SITE_OTHER): Payer: 59 | Admitting: Family Medicine

## 2014-04-21 ENCOUNTER — Encounter: Payer: Self-pay | Admitting: Family Medicine

## 2014-04-21 VITALS — BP 118/72 | HR 64 | Ht 64.0 in | Wt 158.0 lb

## 2014-04-21 DIAGNOSIS — R21 Rash and other nonspecific skin eruption: Secondary | ICD-10-CM

## 2014-04-21 DIAGNOSIS — R05 Cough: Secondary | ICD-10-CM

## 2014-04-21 DIAGNOSIS — R059 Cough, unspecified: Secondary | ICD-10-CM

## 2014-04-21 DIAGNOSIS — J309 Allergic rhinitis, unspecified: Secondary | ICD-10-CM

## 2014-04-21 NOTE — Progress Notes (Signed)
Chief Complaint  Patient presents with  . Cough    cough x several months, tickle in her throat all the timem believes it is allergy related. Taking Flonase and Zyrtec without any relief. Also thinks she may have ringworm on her abdomen-rash x 3 weeks.   She has had a cough for a few months. She gets a tickle in her throat, which causes spasm of dry cough, requiring her to leave patient's room; relieved by taking a sip of water. This has been occuring 2-3 times/day.  She isn't having the facial congestion like she gets in the Spring.  No cough at night.  Denies discolored mucus/phlegm, no fevers, chlils. Sometimes she has some throat clearing after eating, but no significant heartburn or reflux.  Rash on abdomen was first noted 3 weeks ago. It is intermittently itchy.  This feels very different than her eczema.  She initially thought it could be chafing from the bra, but then it started to have a round shape, and she is concerned about ringworm. Her son has tinea versicolor and she has been using his medicine (topical ketoconazole?) for a couple of weeks without any improvement.  No new products or exposures. She has noticed some spread, with it feeling raised/rough in the surrounding area, and noticing a spot recently on her right breast.  PMH, PSH, SH reviewed, unchanged  ROS:  No fever, chills, headaches, dizziness, shortness of breath, chest pain.  No bleeding, bruising.  +rash as per HPI.  +intentional weight loss.  No nausea, vomiting, heartburn, abdominal pain, bowel changes or other concerns.  Moods are good.  No urinary complaints or other problems.  See HPI  PHYSICAL EXAM: BP 118/72 mmHg  Pulse 64  Ht 5\' 4"  (1.626 m)  Wt 158 lb (71.668 kg)  BMI 27.11 kg/m2  LMP 03/24/2014 Well developed, pleasant female in no distress.  No coughing or sniffling during visit. Rare throat-clearing. HEENT:  PERRL, EOMI, conjunctiva clear.  TM's and EAC's are normal bilaterally.  Nasal mucosa is mildly  edematous, pale.  There is clear stringy mucus R>L. Sinuses are nontender.  OP is normal Neck: no lymphadenopathy, thyromegaly or mass Heart: regular rate and rhythm Lungs: clear bilaterally.  Good air movement.  No wheezes, rales, ronchi. No cough or wheezing with forced expiration. Skin: quarter-sized area of redness in left side of epigastrium.  There is a raised border superiorly (doesn't continue all around at the bottom).  Slight central clearing, but not complete.  Skin is dry, and there is some surrounding dryness with slight erythema.  Small area of redness also on left breast.  ASSESSMENT/PLAN:  Allergic rhinitis, unspecified allergic rhinitis type  Cough - based on exam, is likely related to PND; cannot r/o GERD as contributing,but doubt. No infection noted. Sniffing flonase too hard--shown proper technique  Rash, skin - on trunk.  Possible early pityriasis rosea (with herald patch and very early start of spread) vs tinea corporis  Educated extensively on medication recommendations (proper flonase technique, OTC meds, risks/side effects). Educated re: Ddx of rash, and treatments. All questions answered.   25 min visit, more than 1/2 spent counseling.   Drink plenty of fluids. Continue with zyrtec every day, along with the flonase 2 sprays each nostril (sniff gently!) Consider using sudafed during the day (to further dry up the nasal secretions and lessen the postnasal drainage). Consider using Mucinex daily (plain or DM)  We can consider using tessalon as needed, if dry cough remains a problem at work.  Rash--cannot exclude ringworm.  It is possible, however, that the rash is going to get much worse, and if this spreads throughout the trunk, then it might be pityriasis rosea (in which case the antifungal creams do nothing).  Go ahead and use antifungal cream 2 times/day for up to 2-3 weeks (lotrimin, lamisil, tinactin, etc).  If not helping, then not likely fungal. If it is  pityriasis, it will eventually resolve on its own. Your skin seems very dry--please use a good moisturizer/emollient (ie eucerin, cetaphil).

## 2014-04-21 NOTE — Patient Instructions (Signed)
Drink plenty of fluids. Continue with zyrtec every day, along with the flonase 2 sprays each nostril (sniff gently!) Consider using sudafed during the day (to further dry up the nasal secretions and lessen the postnasal drainage). Consider using Mucinex daily (plain or DM)  We can consider using tessalon as needed, if dry cough remains a problem at work.  Rash--cannot exclude ringworm.  It is possible, however, that the rash is going to get much worse, and if this spreads throughout the trunk, then it might be pityriasis rosea (in which case the antifungal creams do nothing).  Go ahead and use antifungal cream 2 times/day for up to 2-3 weeks (lotrimin, lamisil, tinactin, etc).  If not helping, then not likely fungal. If it is pityriasis, it will eventually resolve on its own. Your skin seems very dry--please use a good moisturizer/emollient (ie eucerin, cetaphil).  Body Ringworm Ringworm (tinea corporis) is a fungal infection of the skin on the body. This infection is not caused by worms, but is actually caused by a fungus. Fungus normally lives on the top of your skin and can be useful. However, in the case of ringworms, the fungus grows out of control and causes a skin infection. It can involve any area of skin on the body and can spread easily from one person to another (contagious). Ringworm is a common problem for children, but it can affect adults as well. Ringworm is also often found in athletes, especially wrestlers who share equipment and mats.  CAUSES  Ringworm of the body is caused by a fungus called dermatophyte. It can spread by:  Touchingother people who are infected.  Touchinginfected pets.  Touching or sharingobjects that have been in contact with the infected person or pet (hats, combs, towels, clothing, sports equipment). SYMPTOMS   Itchy, raised red spots and bumps on the skin.  Ring-shaped rash.  Redness near the border of the rash with a clear center.  Dry  and scaly skin on or around the rash. Not every person develops a ring-shaped rash. Some develop only the red, scaly patches. DIAGNOSIS  Most often, ringworm can be diagnosed by performing a skin exam. Your caregiver may choose to take a skin scraping from the affected area. The sample will be examined under the microscope to see if the fungus is present.  TREATMENT  Body ringworm may be treated with a topical antifungal cream or ointment. Sometimes, an antifungal shampoo that can be used on your body is prescribed. You may be prescribed antifungal medicines to take by mouth if your ringworm is severe, keeps coming back, or lasts a long time.  HOME CARE INSTRUCTIONS   Only take over-the-counter or prescription medicines as directed by your caregiver.  Wash the infected area and dry it completely before applying yourcream or ointment.  When using antifungal shampoo to treat the ringworm, leave the shampoo on the body for 3-5 minutes before rinsing.   Wear loose clothing to stop clothes from rubbing and irritating the rash.  Wash or change your bed sheets every night while you have the rash.  Have your pet treated by your veterinarian if it has the same infection. To prevent ringworm:   Practice good hygiene.  Wear sandals or shoes in public places and showers.  Do not share personal items with others.  Avoid touching red patches of skin on other people.  Avoid touching pets that have bald spots or wash your hands after doing so. SEEK MEDICAL CARE IF:   Your  rash continues to spread after 7 days of treatment.  Your rash is not gone in 4 weeks.  The area around your rash becomes red, warm, tender, and swollen. Document Released: 06/01/2000 Document Revised: 02/27/2012 Document Reviewed: 12/17/2011 [facility] Patient Information 2015 [place], [place]. This information is not intended to replace advice given to you by your health care provider. Make sure you discuss any questions you  have with your health care provider.  Pityriasis Rosea Pityriasis rosea is a rash which is probably caused by a virus. It generally starts as a scaly, red patch on the trunk (the area of the body that a t-shirt would cover) but does not appear on sun exposed areas. The rash is usually preceded by an initial larger spot called the "herald patch" a week or more before the rest of the rash appears. Generally within one to two days the rash appears rapidly on the trunk, upper arms, and sometimes the upper legs. The rash usually appears as flat, oval patches of scaly pink color. The rash can also be raised and one is able to feel it with a finger. The rash can also be finely crinkled and may slough off leaving a ring of scale around the spot. Sometimes a mild sore throat is present with the rash. It usually affects children and young adults in the spring and autumn. Women are more frequently affected than men. TREATMENT  Pityriasis rosea is a self-limited condition. This means it goes away within 4 to 8 weeks without treatment. The spots may persist for several months, especially in darker-colored skin after the rash has resolved and healed. Benadryl and steroid creams may be used if itching is a problem. SEEK MEDICAL CARE IF:   Your rash does not go away or persists longer than three months.  You develop fever and joint pain.  You develop severe headache and confusion.  You develop breathing difficulty, vomiting and/or extreme weakness. Document Released: 07/11/2001 Document Revised: 08/27/2011 Document Reviewed: 07/30/2008 [facility] Patient Information 2015 [place], [place]. This information is not intended to replace advice given to you by your health care provider. Make sure you discuss any questions you have with your health care provider.

## 2015-02-25 LAB — HM MAMMOGRAPHY: HM Mammogram: NEGATIVE

## 2015-06-21 ENCOUNTER — Encounter: Payer: Self-pay | Admitting: Sports Medicine

## 2015-06-21 ENCOUNTER — Ambulatory Visit (INDEPENDENT_AMBULATORY_CARE_PROVIDER_SITE_OTHER): Payer: 59 | Admitting: Sports Medicine

## 2015-06-21 VITALS — BP 100/70 | Ht 64.0 in | Wt 167.0 lb

## 2015-06-21 DIAGNOSIS — M25561 Pain in right knee: Secondary | ICD-10-CM

## 2015-06-21 MED ORDER — MELOXICAM 15 MG PO TABS: ORAL_TABLET | ORAL | Status: DC

## 2015-06-21 MED FILL — MELOXICAM 15 MG TABLET: 15 | 40 days supply | Qty: 40 | Fill #0

## 2015-06-21 NOTE — Progress Notes (Signed)
   Subjective:    Patient ID: Sheila Obrien, female    DOB: 1971/01/14, 45 y.o.   MRN: [id number]  HPI  Sheila Obrien is a 45 y/o female who presents for evaluation of right knee pain that began 6 days ago on 06/15/15 while doing burpee exercises as a part of a new exercise regimen she began 3 weeks ago after not exercising for several months prior to this. She stated that as she began to bring her legs from extended behind her to a squatting position she felt a sharp twinge followed by pain along the medial aspect of the knee joint. Since the time of the injury she has experienced intermittent pain along the medial aspect of the knee joint radiating superior to the patella which she has attempted to relieve with occasional Ibuprofen (400-600mg ) along with ice and elevation. She reports that there are periods of time during which the knee feels fine and she is able to walk normally however there are frequent instances of the knee joint catching and being locked in extension which she has to carefully move the joint and give it time before she is able to move it normally again. She also reports feeling occasional clicking in the joint as well as mild swelling superior to the patella. She has had no previous injury to her right knee joint and only had minor issues with patellar tendonitis of the left knee several years ago.   Review of Systems Musculoskeletal: Positive for right knee pain, swelling, tenderness. Negative for left knee pain, bilateral feet pain, weakness with walking, joint redness. Review of systems otherwise negative except as specified in HPI    Objective:   Physical Exam General: Caucasian female resting on exam table in no acute distress.  Right knee: Range of motion is 0-120. Trace to 1+ effusion. Tender to palpation along the medial joint line with pain but no popping with McMurray's. Knee is stable to valgus and varus stressing. Negative Lachman's. Negative anterior drawer.  Negative posterior drawer. Extensor mechanism is intact. Neurovascularly intact distally. Walking with a slight limp.  Psych: Normal appearance and response to questions.      Assessment & Plan:   1) Right Knee Pain and swelling-rule out bucket-handle medial meniscal tear - MRI Right knee - Mobic 15mg  daily PRN for pain - Knee compression sleeve - Will call with MRI results with follow-up depending upon results of MRI

## 2015-06-21 NOTE — Patient Instructions (Signed)
MRI Right Knee is: Thursday January 12th [facility] [address] [id number]

## 2015-06-27 ENCOUNTER — Ambulatory Visit (HOSPITAL_COMMUNITY)
Admission: RE | Admit: 2015-06-27 | Discharge: 2015-06-27 | Disposition: A | Discharge status: Home / Self Care | Payer: 59 | Source: Ambulatory Visit | Attending: Sports Medicine | Admitting: Sports Medicine

## 2015-06-27 DIAGNOSIS — M25561 Pain in right knee: Secondary | ICD-10-CM | POA: Insufficient documentation

## 2015-06-28 ENCOUNTER — Telehealth: Payer: Self-pay | Admitting: Sports Medicine

## 2015-06-28 DIAGNOSIS — M25561 Pain in right knee: Secondary | ICD-10-CM

## 2015-06-28 NOTE — Telephone Encounter (Signed)
-----   Message from Babette Relic sent at 06/28/2015 12:27 PM EST ----- Regarding: MRI Resuslts Contact: [phone] Was able to have MRI done early (yesterday). Please call with results when you get a chance.  [phone]

## 2015-06-28 NOTE — Telephone Encounter (Signed)
I spoke with the patient on the phone today after reviewing the MRI of her right knee. There is no evidence of a bucket handle meniscal tear but she does have a 7.6 mm x 12.3 mm osteochondral defect along the lateral femoral condyle. She also has a very small area of high-grade partial thickness cartilage loss of the medial patellar facet with some subchondral reactive marrow change. The patient's pain has improved some but her main concern is persistent locking of the knee which requires manipulation for her to regain motion. It does sound like she is getting true locking as she describes episodes where she is unable to move her knee without repositioning and at times she will feel a pop which will then free up her knee motion.  Based on her MRI, as well as her history, I've recommended referral to Dr.Daldorf for his input. If he believes that she has true locking of the knee then it may be that she has a small occult loose body not seen on the MRI with the donor site possibly being the OCD seen in the lateral femoral condyle. She may also eventually need microfracture if she fails conservative treatment. I'll defer further workup and treatment to the discretion of Dr.Daldorf and the patient will follow-up with me as needed.

## 2015-06-29 NOTE — Addendum Note (Signed)
Addended by: Cyd Silence on: 06/29/2015 09:11 AM   Modules accepted: Orders

## 2015-06-29 NOTE — Telephone Encounter (Signed)
Dr Laurey [facility] Wednesday Jan. 18th at [address], [place], [place] [address] Phone: [phone] Will fax notes to [phone]

## 2015-06-30 ENCOUNTER — Ambulatory Visit (HOSPITAL_COMMUNITY): Admission: RE | Admit: 2015-06-30 | Payer: 59 | Source: Ambulatory Visit

## 2015-07-06 DIAGNOSIS — M1711 Unilateral primary osteoarthritis, right knee: Secondary | ICD-10-CM | POA: Diagnosis not present

## 2015-07-25 ENCOUNTER — Ambulatory Visit: Payer: 59 | Attending: Orthopaedic Surgery | Admitting: Physical Therapy

## 2015-07-25 DIAGNOSIS — R6889 Other general symptoms and signs: Secondary | ICD-10-CM | POA: Diagnosis not present

## 2015-07-25 DIAGNOSIS — R29898 Other symptoms and signs involving the musculoskeletal system: Secondary | ICD-10-CM

## 2015-07-25 DIAGNOSIS — M25561 Pain in right knee: Secondary | ICD-10-CM | POA: Diagnosis not present

## 2015-07-25 DIAGNOSIS — M6289 Other specified disorders of muscle: Secondary | ICD-10-CM | POA: Insufficient documentation

## 2015-07-25 MED FILL — BUPROPION HCL XL 300 MG TAB: 300 | 90 days supply | Qty: 90 | Fill #3

## 2015-07-25 NOTE — Patient Instructions (Signed)
   Wania Longstreth PT, DPT, LAT, ATC  [facility] Phone: [phone]     

## 2015-07-25 NOTE — Therapy (Signed)
[place], [place], [address] Phone: [phone]   Fax:  [phone]  Physical Therapy Evaluation  Patient Details  Name: Sheila Obrien MRN: [id number] Date of Birth: 01-Jan-1971 Referring Provider: Melrose Nakayama MD  Encounter Date: 07/25/2015      PT End of Session - 07/25/15 0921    Visit Number 1   Number of Visits 12   Date for PT Re-Evaluation 09/05/15   PT Start Time 0800   PT Stop Time 0848   PT Time Calculation (min) 48 min   Activity Tolerance Patient tolerated treatment well   Behavior During Therapy [facility] for tasks assessed/performed      Past Medical History  Diagnosis Date  . Anxiety     managed by Dr. Toy Care  . Depression     managed by Dr. Toy Care  . Ureteral reflux     s/p surgery  . Low back pain     intermittent problems with SI joint  . Eczema     Past Surgical History  Procedure Laterality Date  . Bilateral ureteral reimplantation  05/1990    Dr.Humphries  . Cesarean section  2004    There were no vitals filed for this visit.  Visit Diagnosis:  Right knee pain - Plan: PT plan of care cert/re-cert  Weakness of both hips - Plan: PT plan of care cert/re-cert  Activity intolerance - Plan: PT plan of care cert/re-cert      Subjective Assessment - 07/25/15 0806    Subjective pt is a 45 y.o F with CC of R knee pain 06/15/2015 from doing burpees and was coming back up form push up position and felt a pop/ twinge and it swelled up a couple hours later with pain noted more the next day. she reports it has gotten better since onset, she states initially it was locking but it has stopped but it still clicks especially with descending stairs, she denies any giving way of the knee.  she has halted her exercise routine at this point in time except for walking. she reports over the weekend pain incresased medially and in the back of the knee. she recied a cortizone injeciton about 2-3  weeks and that it helped.    Pertinent History low back pain   How long can you sit comfortably? unlimited   How long can you stand comfortably? couple hours   How long can you walk comfortably? 4 hours   Diagnostic tests MRI 06/27/2015 full thickings cartilage loss of the lateral femoral condyle along posterior weight bearing surface, and partial thicknessloss of the medial patellar facet   Patient Stated Goals to get to working out specifically jogging, p90x    Currently in Pain? Yes   Pain Score 0-No pain  7/10 bending the knee and geting up from sitting, twisting   Pain Location Knee   Pain Orientation Right   Pain Descriptors / Indicators Sharp  stiff   Pain Type Chronic pain   Pain Onset More than a month ago   Pain Frequency Intermittent   Aggravating Factors  twisting, bending, standing after sitting for a while, supine hip flexion with knee bent   Pain Relieving Factors RICE            [facility] PT Assessment - 07/25/15 0816    Assessment   Medical Diagnosis R knee pain   Referring Provider Melrose Nakayama MD   Onset Date/Surgical Date 06/15/15   Hand Dominance Right  Next MD Visit 08/15/2015   Prior Therapy yes  for SIJ   Precautions   Precaution Comments No boot camp  but can walk, swim, or bike   Restrictions   Weight Bearing Restrictions No   Balance Screen   Has the patient fallen in the past 6 months No   Has the patient had a decrease in activity level because of a fear of falling?  No   Is the patient reluctant to leave their home because of a fear of falling?  No   Home Environment   Living Environment Private residence   Living Arrangements Spouse/significant other;Children   Available Help at Discharge Available 24 hours/day   Type of [place] to enter   Entrance Stairs-Number of Steps 5   Entrance Stairs-Rails --  getting renovated will having [place] Two level   Alternate Level Stairs-Number of Steps 15    Alternate Level Stairs-Rails Right   Home Equipment --  body helix knee sleeve   Prior Function   Level of Independence Independent;Independent with basic ADLs   Vocation Full time employment  speech pathologist   Vocation Requirements prolonged walking, sitting, documenting, intemermittent lifting   Leisure reading, playing with kids, working out   [employer]   Overall Cognitive Status Within Functional Limits for tasks assessed   Observation/Other Assessments   Focus on Therapeutic Outcomes (FOTO)  43% limited  32% limited   ROM / Strength   AROM / PROM / Strength AROM;PROM;Strength   AROM   AROM Assessment Site Hip;Knee   Right/Left Hip Right;Left   Right/Left Knee Right;Left   Right Knee Extension -5  soreness noted in the in the back of the knee at end range   Right Knee Flexion 135  tenderness noted in the back of the knee at end range   Left Knee Extension 0   Left Knee Flexion 142   PROM   PROM Assessment Site Knee   Right/Left Knee Right;Left   Right Knee Extension 0   Right Knee Flexion 142   Strength   Strength Assessment Site Hip;Knee   Right/Left Hip Right;Left   Right Hip Flexion 4+/5   Right Hip Extension 3+/5   Right Hip ABduction 3+/5   Right Hip ADduction 4+/5   Left Hip Flexion 4+/5   Left Hip Extension 3+/5   Left Hip ABduction 3+/5   Left Hip ADduction 4+/5   Right/Left Knee Right;Left   Right Knee Flexion 5/5  pain noted in the posterior aspec of the knee during testing   Right Knee Extension 5/5   Left Knee Flexion 5/5   Left Knee Extension 5/5   Palpation   Patella mobility mild hypombility of the R patella compared bil   Palpation comment tendnerness noted at the distal attachements of the hamstring tendons and along the latera femoral condyle   Special Tests    Special Tests Knee Special Tests;Meniscus Tests   Knee Special tests  Step-up/Step Down Test   Meniscus Tests McMurray Test  thessaly negative   Step-up/Step Down    Findings  Positive   Side  --  bil    Comments for medial collapse and grinding with increased pain on the R   McMurray Test   Findings Negative   Ambulation/Gait   Gait Pattern Step-through pattern  PT Education - 07/25/15 [phone]    Education provided Yes   Education Details evlaution findings, POC, goals, HEP   Person(s) Educated Patient   Methods Explanation   Comprehension Verbalized understanding          PT Short Term Goals - 07/25/15 1244    PT SHORT TERM GOAL #1   Title She will be I with inital HEP (08/15/2015)   Time 3   Period Weeks   Status New   PT SHORT TERM GOAL #2   Title pt will be be able to verbalize and demonstrate techniques on how to reduce and R knee inflammation and pain via RICE and HEP (08/15/2015)   Time 3   Period Weeks   Status New           PT Long Term Goals - 07/25/15 1246    PT LONG TERM GOAL #1   Title pt will be I with all HEP as of last visit (09/05/2015)   Time 6   Period Weeks   Status New   PT LONG TERM GOAL #2   Title pt will improve bil hip abduction/ extension strength to >/=4+/5 to assist with prolonged standing/walking activities and promote efficient knee mechanics.    Time 6   Period Weeks   Status New   PT LONG TERM GOAL #3   Title pt will be able to ascend/ descend >/= 15 steps with </= 2/10 pain to assist with job related activities (09/05/2015)   Time 6   Period Weeks   Status New   PT LONG TERM GOAL #4   Title pt will improve her FOTO score to >/= 68 to demonstrate improvement in function (09/05/2015)   Time 6   Period Weeks   Status New   PT LONG TERM GOAL #5   Title pt will be able resume working out at the gym with </= 2/10 pain in the R knee to assist with personal goal of working out and losing weight (09/05/2015)   Time [place] - 07/25/15 0944    Clinical Impression Statement Sheila Obrien presents as a low complexity evaluation  with R knee pain that started on 06/15/2015 from a boot camp class doing burpees. She demonstrates mild limitation with AROM secondary to soreness at end ranges noted in the posterior aspect of the knee. MMT was [facility] for the R knee with pain during hamstring testing, bil hip abduction/ extension MMT demonstrated weakness. Palpaiton revealed tendnerss at the distal hamstring tendons and the lateral femoral condyles. Meniscal testing was negative, Step down testing as positive bil for crepitis and medial collapse R>L. she would benefit from physical therapy to decrease her R knee pain improve strength and return to PLOF by addressing the impairments listed.    Pt will benefit from skilled therapeutic intervention in order to improve on the following deficits Pain;Difficulty walking;Decreased strength;Decreased endurance;Decreased activity tolerance;Hypomobility   Rehab Potential Good   PT Frequency 2x / week   PT Duration 6 weeks   PT Treatment/Interventions ADLs/Self Care Home Management;Electrical Stimulation;Iontophoresis 4mg /ml Dexamethasone;Moist Heat;Cryotherapy;Ultrasound;Therapeutic exercise;Therapeutic activities;Manual techniques;Taping;Dry needling;Passive range of motion;Patient/family education   PT Next Visit Plan assess and review HEP, hip strengthening, hamstring stretching, modalities PRN   PT Home Exercise Plan hamstring stretching, single to chest, standing hip extension/ abduction   Consulted and Agree with Plan of Care Patient  Problem List Patient Active Problem List   Diagnosis Date Noted  . Knee pain 02/19/2012  . Fibrocystic breast 02/13/2012  . Eczema 02/13/2012   [place]arr [place] PT, DPT, LAT, ATC  07/25/2015  12:55 PM     [facility] [address] [place], [place], [address] Phone: [phone]   Fax:  [phone]  Name: Sheila Obrien MRN: [id number] Date of Birth: 1970/07/26

## 2015-07-28 ENCOUNTER — Ambulatory Visit: Payer: 59 | Admitting: Physical Therapy

## 2015-07-28 DIAGNOSIS — R29898 Other symptoms and signs involving the musculoskeletal system: Secondary | ICD-10-CM

## 2015-07-28 DIAGNOSIS — M25561 Pain in right knee: Secondary | ICD-10-CM | POA: Diagnosis not present

## 2015-07-28 DIAGNOSIS — R6889 Other general symptoms and signs: Secondary | ICD-10-CM

## 2015-07-28 DIAGNOSIS — M6289 Other specified disorders of muscle: Secondary | ICD-10-CM | POA: Diagnosis not present

## 2015-07-28 NOTE — Therapy (Signed)
[place], [place], [address] Phone: [phone]   Fax:  [phone]  Physical Therapy Treatment  Patient Details  Name: Sheila Obrien MRN: [id number] Date of Birth: November 26, 1970 Referring Provider: Melrose Nakayama MD  Encounter Date: 07/28/2015      PT End of Session - 07/28/15 1602    Visit Number 2   Number of Visits 12   Date for PT Re-Evaluation 09/05/15   PT Start Time 0345   PT Stop Time 0427   PT Time Calculation (min) 42 min      Past Medical History  Diagnosis Date  . Anxiety     managed by Dr. Toy Care  . Depression     managed by Dr. Toy Care  . Ureteral reflux     s/p surgery  . Low back pain     intermittent problems with SI joint  . Eczema     Past Surgical History  Procedure Laterality Date  . Bilateral ureteral reimplantation  05/1990    Dr.Humphries  . Cesarean section  2004    There were no vitals filed for this visit.  Visit Diagnosis:  Right knee pain  Weakness of both hips  Activity intolerance      Subjective Assessment - 07/28/15 1549    Subjective It comes and goes. I think the injection is wearing off. After 15 minutes of sitting in car, was stiff and caused me to limp for a short distance.    Currently in Pain? Yes   Pain Score 1                          [facility] Adult PT Treatment/Exercise - 07/28/15 0001    Exercises   Exercises Knee/Hip   Knee/Hip Exercises: Stretches   Active Hamstring Stretch 3 reps;30 seconds   Quad Stretch 3 reps;30 seconds   Knee/Hip Exercises: Standing   Hip Abduction 2 sets;10 reps   Hip Extension 2 sets;10 reps   Knee/Hip Exercises: Supine   Straight Leg Raises 2 sets;10 reps   Straight Leg Raises Limitations bilateral   Knee/Hip Exercises: Sidelying   Clams x 20 bilateral   Manual Therapy   Manual Therapy Soft tissue mobilization   Soft tissue mobilization soft tissue work to posterior knee, distal hamstrings  in area of tenderness mostly lateral hamstring.                   PT Short Term Goals - 07/25/15 1244    PT SHORT TERM GOAL #1   Title She will be I with inital HEP (08/15/2015)   Time 3   Period Weeks   Status New   PT SHORT TERM GOAL #2   Title pt will be be able to verbalize and demonstrate techniques on how to reduce and R knee inflammation and pain via RICE and HEP (08/15/2015)   Time 3   Period Weeks   Status New           PT Long Term Goals - 07/25/15 1246    PT LONG TERM GOAL #1   Title pt will be I with all HEP as of last visit (09/05/2015)   Time 6   Period Weeks   Status New   PT LONG TERM GOAL #2   Title pt will improve bil hip abduction/ extension strength to >/=4+/5 to assist with prolonged standing/walking activities and promote efficient knee mechanics.    Time 6  Period Weeks   Status New   PT LONG TERM GOAL #3   Title pt will be able to ascend/ descend >/= 15 steps with </= 2/10 pain to assist with job related activities (09/05/2015)   Time 6   Period Weeks   Status New   PT LONG TERM GOAL #4   Title pt will improve her FOTO score to >/= 68 to demonstrate improvement in function (09/05/2015)   Time 6   Period Weeks   Status New   PT LONG TERM GOAL #5   Title pt will be able resume working out at the gym with </= 2/10 pain in the R knee to assist with personal goal of working out and losing weight (09/05/2015)   Time 6   Period Weeks   Status New               Plan - 07/28/15 1651    Clinical Impression Statement Pt reports she was unable to complete HEP however plans to be consistent. Review of HEP with addition of quad stretch. Soft tissue work to distal hamstring with palpable tenderness lateral hamstring. Pt tolerated exercises well with no pain only muscle fatigue. Continues to lack full knee extension on right.   PT Next Visit Plan assess and review HEP, hip strengthening, hamstring stretching and manual for full knee extension,  modalities PRN        Problem List Patient Active Problem List   Diagnosis Date Noted  . Knee pain 02/19/2012  . Fibrocystic breast 02/13/2012  . Eczema 02/13/2012    Dorene Ar, PTA 07/28/2015, 4:53 PM  [place], [place], [address] Phone: [phone]   Fax:  [phone]  Name: LASONDA UMBARGER MRN: [id number] Date of Birth: 07/19/1970

## 2015-07-28 NOTE — Patient Instructions (Signed)
Quadriceps Stretch    Pull left heel in toward buttocks until a comfortable stretch is felt in front of thigh. Hold __30__ seconds. Repeat _3___ times per set. Do _1___ sets per session. Do __2__ sessions per day.  http://orth.exer.us/155   Copyright  VHI. All rights reserved.

## 2015-08-02 ENCOUNTER — Ambulatory Visit: Payer: 59 | Admitting: Physical Therapy

## 2015-08-02 DIAGNOSIS — R6889 Other general symptoms and signs: Secondary | ICD-10-CM | POA: Diagnosis not present

## 2015-08-02 DIAGNOSIS — M25561 Pain in right knee: Secondary | ICD-10-CM | POA: Diagnosis not present

## 2015-08-02 DIAGNOSIS — M6289 Other specified disorders of muscle: Secondary | ICD-10-CM | POA: Diagnosis not present

## 2015-08-02 DIAGNOSIS — R29898 Other symptoms and signs involving the musculoskeletal system: Secondary | ICD-10-CM

## 2015-08-02 NOTE — Therapy (Signed)
[place], [place], [address] Phone: [phone]   Fax:  [phone]  Physical Therapy Treatment  Patient Details  Name: Sheila Obrien MRN: [id number] Date of Birth: 10-Jan-1971 Referring Provider: Melrose Nakayama MD  Encounter Date: 08/02/2015      PT End of Session - 08/02/15 1556    Visit Number 3   Number of Visits 12   Date for PT Re-Evaluation 09/05/15   PT Start Time 0352   PT Stop Time 0432   PT Time Calculation (min) 40 min      Past Medical History  Diagnosis Date  . Anxiety     managed by Dr. Toy Care  . Depression     managed by Dr. Toy Care  . Ureteral reflux     s/p surgery  . Low back pain     intermittent problems with SI joint  . Eczema     Past Surgical History  Procedure Laterality Date  . Bilateral ureteral reimplantation  05/1990    Dr.Humphries  . Cesarean section  2004    There were no vitals filed for this visit.  Visit Diagnosis:  Right knee pain  Weakness of both hips  Activity intolerance      Subjective Assessment - 08/02/15 1556    Subjective I just feel so stiff.                          [facility] Adult PT Treatment/Exercise - 08/02/15 0001    Knee/Hip Exercises: Aerobic   Recumbent Bike L3 x 5 min   Knee/Hip Exercises: Machines for Strengthening   Cybex Leg Press 2 plates x 20 x 2    Knee/Hip Exercises: Supine   Other Supine Knee/Hip Exercises 4 way hip supine 2# 10 x 2 each bilateral   Modalities   Modalities Ultrasound   Ultrasound   Ultrasound Location right medial joint line   Ultrasound Parameters 50% 1.0 w/cmw 3 mhz x 8 min   Ultrasound Goals Pain                  PT Short Term Goals - 08/02/15 1618    PT SHORT TERM GOAL #1   Title She will be I with inital HEP (08/15/2015)   Time 3   Period Weeks   Status Achieved   PT SHORT TERM GOAL #2   Title pt will be be able to verbalize and demonstrate techniques on how  to reduce and R knee inflammation and pain via RICE and HEP (08/15/2015)   Time 3   Period Weeks   Status Achieved           PT Long Term Goals - 07/25/15 1246    PT LONG TERM GOAL #1   Title pt will be I with all HEP as of last visit (09/05/2015)   Time 6   Period Weeks   Status New   PT LONG TERM GOAL #2   Title pt will improve bil hip abduction/ extension strength to >/=4+/5 to assist with prolonged standing/walking activities and promote efficient knee mechanics.    Time 6   Period Weeks   Status New   PT LONG TERM GOAL #3   Title pt will be able to ascend/ descend >/= 15 steps with </= 2/10 pain to assist with job related activities (09/05/2015)   Time 6   Period Weeks   Status New   PT LONG TERM GOAL #  4   Title pt will improve her FOTO score to >/= 68 to demonstrate improvement in function (09/05/2015)   Time 6   Period Weeks   Status New   PT LONG TERM GOAL #5   Title pt will be able resume working out at the gym with </= 2/10 pain in the R knee to assist with personal goal of working out and losing weight (09/05/2015)   Time 6   Period Weeks   Status New               Plan - 08/02/15 1716    Clinical Impression Statement Increased weight for 4 way hip with patient tolerating 2 sets before fatigue. Began leg press with no increased pain. Trial of medial ultrasound. Pt reports less posterior knee pain since last visit. Pt demonstrates improved knee extension right.    PT Next Visit Plan closed chain as tolerated ,  hip strengthening, hamstring stretching and manual for full knee extension, modalities PRN        Problem List Patient Active Problem List   Diagnosis Date Noted  . Knee pain 02/19/2012  . Fibrocystic breast 02/13/2012  . Eczema 02/13/2012    Dorene Ar, PTA 08/02/2015, 5:19 PM  [place], [place], [address] Phone: [phone]   Fax:   [phone]  Name: Sheila Obrien MRN: [id number] Date of Birth: 1970/09/17

## 2015-08-04 ENCOUNTER — Ambulatory Visit: Payer: 59 | Admitting: Physical Therapy

## 2015-08-04 DIAGNOSIS — M6289 Other specified disorders of muscle: Secondary | ICD-10-CM | POA: Diagnosis not present

## 2015-08-04 DIAGNOSIS — R6889 Other general symptoms and signs: Secondary | ICD-10-CM

## 2015-08-04 DIAGNOSIS — M25561 Pain in right knee: Secondary | ICD-10-CM | POA: Diagnosis not present

## 2015-08-04 DIAGNOSIS — R29898 Other symptoms and signs involving the musculoskeletal system: Secondary | ICD-10-CM

## 2015-08-04 NOTE — Therapy (Signed)
Sheila Obrien, [place], [address] Phone: [phone]   Fax:  [phone]  Physical Therapy Treatment  Patient Details  Name: Sheila Obrien MRN: [id number] Date of Birth: August 15, 1970 Referring Provider: Melrose Nakayama MD  Encounter Date: 08/04/2015      PT End of Session - 08/04/15 1652    Visit Number 4   Number of Visits 12   Date for PT Re-Evaluation 09/05/15   PT Start Time [id number]   PT Stop Time 1643   PT Time Calculation (min) 48 min   Activity Tolerance Patient tolerated treatment well   Behavior During Therapy [facility] for tasks assessed/performed      Past Medical History  Diagnosis Date  . Anxiety     managed by Dr. Toy Care  . Depression     managed by Dr. Toy Care  . Ureteral reflux     s/p surgery  . Low back pain     intermittent problems with SI joint  . Eczema     Past Surgical History  Procedure Laterality Date  . Bilateral ureteral reimplantation  05/1990    Dr.Humphries  . Cesarean section  2004    There were no vitals filed for this visit.  Visit Diagnosis:  Right knee pain  Weakness of both hips  Activity intolerance      Subjective Assessment - 08/04/15 1559    Subjective Wanting to get back to being active. Trying to get back to running.    Currently in Pain? Yes   Pain Score 3    Pain Location Knee   Pain Orientation Right   Pain Descriptors / Indicators Aching;Sore   Pain Type Chronic pain   Aggravating Factors  stairs, sitting long periods and getting up, walking fast   Pain Relieving Factors walkking                         [facility] Adult PT Treatment/Exercise - 08/04/15 0001    Ambulation/Gait   Gait Comments 50 feet repeats with incresing speed.   Knee/Hip Exercises: Public affairs consultant 2 reps;30 seconds;Other (comment)   Quad Stretch Limitations addition of towel under knee for increased hip extension   Gastroc Stretch 30 seconds;5 reps   Gastroc Stretch Limitations manual   Other Knee/Hip Stretches foam roller to calf, 30 second holds X 3 locations   Knee/Hip Exercises: Sidelying   Hip ABduction Strengthening;Right;1 set;10 reps   Clams 1X15- min assist first 3 reps for full range   Manual Therapy   Manual Therapy Soft tissue mobilization   Soft tissue mobilization STM/TPR to proximal calf                PT Education - 08/04/15 1651    Education provided Yes   Education Details HEP   Person(s) Educated Patient   Methods Explanation;Handout;Demonstration;Verbal cues;Tactile cues   Comprehension Verbalized understanding;Returned demonstration          PT Short Term Goals - 08/02/15 1618    PT SHORT TERM GOAL #1   Title She will be I with inital HEP (08/15/2015)   Time 3   Period Weeks   Status Achieved   PT SHORT TERM GOAL #2   Title pt will be be able to verbalize and demonstrate techniques on how to reduce and R knee inflammation and pain via RICE and HEP (08/15/2015)   Time 3   Period Weeks   Status Achieved  PT Long Term Goals - 07/25/15 1246    PT LONG TERM GOAL #1   Title pt will be I with all HEP as of last visit (09/05/2015)   Time 6   Period Weeks   Status New   PT LONG TERM GOAL #2   Title pt will improve bil hip abduction/ extension strength to >/=4+/5 to assist with prolonged standing/walking activities and promote efficient knee mechanics.    Time 6   Period Weeks   Status New   PT LONG TERM GOAL #3   Title pt will be able to ascend/ descend >/= 15 steps with </= 2/10 pain to assist with job related activities (09/05/2015)   Time 6   Period Weeks   Status New   PT LONG TERM GOAL #4   Title pt will improve her FOTO score to >/= 68 to demonstrate improvement in function (09/05/2015)   Time 6   Period Weeks   Status New   PT LONG TERM GOAL #5   Title pt will be able resume working out at the gym with </= 2/10 pain in the R knee to assist with personal goal of working  out and losing weight (09/05/2015)   Time [place] - 08/04/15 1653    Clinical Impression Statement Patient reports that she is making some progress gradually. Had some pain when walking quickly into the clinic. Patient indicating that pain is coming more from the proximal calf region and was reproduced with palpation and with calf stretch initially. Session included hip abduction strengthening along with stretching through the calf musculatue. HEP modified accordingly. By end of session the patient reported that fast ambulation was much improved. Patient is going to try ambulation over the weekend with calf stretches as needed and report response.    PT Next Visit Plan Assess how walking went over the weekend and if calf stretches made any changes. Cont. with strengthening program.    PT Home Exercise Plan calf stretch as needed, cont. with hamstrings also.    Consulted and Agree with Plan of Care Patient        Problem List Patient Active Problem List   Diagnosis Date Noted  . Knee pain 02/19/2012  . Fibrocystic breast 02/13/2012  . Eczema 02/13/2012    Cassell Clement, PT, CSCS Pager [phone]  08/04/2015, 4:58 PM  [facility] [address] [place], [place], [address] Phone: [phone]   Fax:  [phone]  Name: Sheila Obrien MRN: [id number] Date of Birth: May 13, 1971

## 2015-08-09 ENCOUNTER — Ambulatory Visit: Payer: 59 | Admitting: Physical Therapy

## 2015-08-09 ENCOUNTER — Ambulatory Visit: Payer: 59

## 2015-08-09 DIAGNOSIS — R6889 Other general symptoms and signs: Secondary | ICD-10-CM

## 2015-08-09 DIAGNOSIS — M25561 Pain in right knee: Secondary | ICD-10-CM

## 2015-08-09 DIAGNOSIS — R29898 Other symptoms and signs involving the musculoskeletal system: Secondary | ICD-10-CM

## 2015-08-09 DIAGNOSIS — M6289 Other specified disorders of muscle: Secondary | ICD-10-CM | POA: Diagnosis not present

## 2015-08-09 NOTE — Therapy (Signed)
[place], [place], [address] Phone: [phone]   Fax:  [phone]  Physical Therapy Treatment  Patient Details  Name: Sheila Obrien MRN: [id number] Date of Birth: [birth year] Referring Provider: Melrose Nakayama MD  Encounter Date: 08/09/2015      PT End of Session - 08/09/15 1612    Visit Number 5   Number of Visits 12   Date for PT Re-Evaluation 09/05/15   PT Start Time 1550   PT Stop Time 1630   PT Time Calculation (min) 40 min   Activity Tolerance Patient tolerated treatment well   Behavior During Therapy [facility] for tasks assessed/performed      Past Medical History  Diagnosis Date  . Anxiety     managed by Dr. Toy Care  . Depression     managed by Dr. Toy Care  . Ureteral reflux     s/p surgery  . Low back pain     intermittent problems with SI joint  . Eczema     Past Surgical History  Procedure Laterality Date  . Bilateral ureteral reimplantation  05/1990    Dr.Humphries  . Cesarean section  2004    There were no vitals filed for this visit.  Visit Diagnosis:  Right knee pain  Weakness of both hips  Activity intolerance      Subjective Assessment - 08/09/15 1558    Subjective Pt believes PT is working. Pt reports 50% improvement since beginning PT. She notes improvements in hip strength and is compliant with HEP. Pt notes less pain in knee.    Currently in Pain? No/denies   Pain Score 0-No pain   Pain Location Knee   Pain Orientation Right   Pain Descriptors / Indicators Aching;Sore   Pain Type Chronic pain   Pain Onset More than a month ago   Pain Frequency Intermittent                         [facility] Adult PT Treatment/Exercise - 08/09/15 0001    Knee/Hip Exercises: Stretches   Active Hamstring Stretch 3 reps;30 seconds   Quad Stretch 3 reps;30 seconds;Other (comment)   Quad Stretch Limitations with hip flexor     Hip Flexor Stretch 3 reps;30 seconds   Hip Flexor Stretch Limitations with quad , supine   Gastroc Stretch 1 rep;30 seconds   Gastroc Stretch Limitations standing- good ROM noted so DC after 1 set    Knee/Hip Exercises: Machines for Strengthening   Cybex Leg Press single leg press 10 reps x 2 sets ( 1 set x 20 lbs, 2nd set  25 lbs)   on [employer] gym   Knee/Hip Exercises: Supine   Bridges with [employer] 10 reps  with core brace    Knee/Hip Exercises: Sidelying   Hip ABduction 2 sets;10 reps  bilat   Clams x 20   bilat    Manual Therapy   Manual Therapy Soft tissue mobilization   Soft tissue mobilization SCS and STM to medial R HS                   PT Short Term Goals - 08/02/15 1618    PT SHORT TERM GOAL #1   Title She will be I with inital HEP (08/15/2015)   Time 3   Period Weeks   Status Achieved   PT SHORT TERM GOAL #2   Title pt will be be able to verbalize and demonstrate  techniques on how to reduce and R knee inflammation and pain via RICE and HEP (08/15/2015)   Time 3   Period Weeks   Status Achieved           PT Long Term Goals - 08/09/15 1648    PT LONG TERM GOAL #1   Title pt will be I with all HEP as of last visit (09/05/2015)   Time 6   Period Weeks   Status On-going   PT LONG TERM GOAL #2   Title pt will improve bil hip abduction/ extension strength to >/=4+/5 to assist with prolonged standing/walking activities and promote efficient knee mechanics.    Time 6   Period Weeks   Status On-going   PT LONG TERM GOAL #3   Title pt will be able to ascend/ descend >/= 15 steps with </= 2/10 pain to assist with job related activities (09/05/2015)   Time 6   Period Weeks   Status On-going   PT LONG TERM GOAL #4   Title pt will improve her FOTO score to >/= 68 to demonstrate improvement in function (09/05/2015)   Time 6   Period Weeks   Status On-going   PT LONG TERM GOAL #5   Title pt will be able resume working out at the gym with </= 2/10 pain in the R knee to assist with personal goal  of working out and losing weight (09/05/2015)   Time 6   Period Weeks   Status On-going               Plan - 08/09/15 1642    Clinical Impression Statement Pt believes PT is working. Pt reports 50% improvement since beginning PT. She notes improvements in hip strength and is compliant with HEP. Pt notes less pain in knee. Pt had L knee TP at medial hamstring and it was diminished following SCS and STM . Added bridges with ADD with good response and Single leg press without incresaed pain.   PT Next Visit Plan Progress knee and hip strengthening in [facility] and stretching.    PT Home Exercise Plan cont. hamstring and quad/ hip flexor  stretches    Consulted and Agree with Plan of Care Patient        Problem List Patient Active Problem List   Diagnosis Date Noted  . Knee pain 02/19/2012  . Fibrocystic breast 02/13/2012  . Eczema 02/13/2012    Dollene Cleveland , PT   08/09/2015, 4:49 PM  [facility] [address] [place], [place], [address] Phone: [phone]   Fax:  [phone]  Name: Sheila Obrien MRN: [id number] Date of Birth: July 06, 1970

## 2015-08-11 ENCOUNTER — Encounter: Payer: 59 | Admitting: Physical Therapy

## 2015-08-15 ENCOUNTER — Encounter: Payer: 59 | Admitting: Physical Therapy

## 2015-08-15 DIAGNOSIS — M1711 Unilateral primary osteoarthritis, right knee: Secondary | ICD-10-CM | POA: Diagnosis not present

## 2015-08-16 ENCOUNTER — Ambulatory Visit: Payer: 59 | Admitting: Physical Therapy

## 2015-08-16 DIAGNOSIS — M25561 Pain in right knee: Secondary | ICD-10-CM | POA: Diagnosis not present

## 2015-08-16 DIAGNOSIS — R6889 Other general symptoms and signs: Secondary | ICD-10-CM

## 2015-08-16 DIAGNOSIS — M6289 Other specified disorders of muscle: Secondary | ICD-10-CM | POA: Diagnosis not present

## 2015-08-16 DIAGNOSIS — R29898 Other symptoms and signs involving the musculoskeletal system: Secondary | ICD-10-CM

## 2015-08-16 NOTE — Therapy (Signed)
[place], [place], [address] Phone: [phone]   Fax:  [phone]  Physical Therapy Treatment  Patient Details  Name: Sheila Obrien MRN: [id number] Date of Birth: Jan 18, 1971 Referring Provider: Melrose Nakayama MD  Encounter Date: 08/16/2015      PT End of Session - 08/16/15 1549    Visit Number 6   Number of Visits 12   Date for PT Re-Evaluation 09/05/15   PT Start Time 0347   PT Stop Time 0430   PT Time Calculation (min) 43 min      Past Medical History  Diagnosis Date  . Anxiety     managed by Dr. Toy Care  . Depression     managed by Dr. Toy Care  . Ureteral reflux     s/p surgery  . Low back pain     intermittent problems with SI joint  . Eczema     Past Surgical History  Procedure Laterality Date  . Bilateral ureteral reimplantation  05/1990    Dr.Humphries  . Cesarean section  2004    There were no vitals filed for this visit.  Visit Diagnosis:  Right knee pain  Weakness of both hips  Activity intolerance      Subjective Assessment - 08/16/15 1553    Subjective No pain today. Went for a walk and felt anterior knee pain in the front of the knee 2/10. Reduced by end of walk.             [facility] PT Assessment - 08/16/15 0001    AROM   Right Knee Extension -2   Right Knee Flexion 138   Strength   Right Hip Extension 4+/5   Right Hip ABduction 4/5   Right Hip ADduction 4+/5   Left Hip Extension 4/5   Left Hip ABduction 4/5   Left Hip ADduction 4+/5                     [facility] Adult PT Treatment/Exercise - 08/16/15 0001    Ambulation/Gait   Stairs Yes   Stairs Assistance 7: Independent   Stair Management Technique No rails   Number of Stairs 16   Height of Stairs 6   Gait Comments No pain negotiating 6 inch stairs, no rails   Knee/Hip Exercises: Stretches   Active Hamstring Stretch 3 reps;30 seconds   Knee/Hip Exercises: Machines for Strengthening   Cybex Leg Press 25 lbs 2 x 10   Knee/Hip Exercises: Standing   Lateral Step Up 2 sets;10 reps;Step Height: 4"   Forward Step Up 2 sets;10 reps;Step Height: 6"   Step Down 1 set;5 reps;Step Height: 4"   Step Down Limitations pain   Other Standing Knee Exercises LAteral band walks with green band in squat position around mat table 1/2 each way. x 2 each way   Knee/Hip Exercises: Supine   Bridges with Clamshell 10 reps;3 sets  green band   Knee/Hip Exercises: Sidelying   Clams x 20                  PT Short Term Goals - 08/02/15 1618    PT SHORT TERM GOAL #1   Title She will be I with inital HEP (08/15/2015)   Time 3   Period Weeks   Status Achieved   PT SHORT TERM GOAL #2   Title pt will be be able to verbalize and demonstrate techniques on how to reduce and R knee inflammation and  pain via RICE and HEP (08/15/2015)   Time 3   Period Weeks   Status Achieved           PT Long Term Goals - 08/16/15 1616    PT LONG TERM GOAL #1   Title pt will be I with all HEP as of last visit (09/05/2015)   Time 6   Period Weeks   Status On-going   PT LONG TERM GOAL #2   Title pt will improve bil hip abduction/ extension strength to >/=4+/5 to assist with prolonged standing/walking activities and promote efficient knee mechanics.    Time 6   Period Weeks   Status Partially Met   PT LONG TERM GOAL #3   Title pt will be able to ascend/ descend >/= 15 steps with </= 2/10 pain to assist with job related activities (09/05/2015)   Baseline 2/10 descending large hospital steps   Time 6   Period Weeks   Status Achieved   PT LONG TERM GOAL #4   Title pt will improve her FOTO score to >/= 68 to demonstrate improvement in function (09/05/2015)   Time 6   Period Weeks   Status On-going   PT LONG TERM GOAL #5   Title pt will be able resume working out at the gym with </= 2/10 pain in the R knee to assist with personal goal of working out and losing weight (09/05/2015)   Time 6   Period  Weeks   Status On-going               Plan - 08/16/15 1611    Clinical Impression Statement Clicking with stair descending. Worked on retro steps ups with increased pain. Pt reports increased pain with descending larger steps at job.  Lateral and forward more comfortable. Pt with questions about return to jogging. MD said to try. Added lateral band walks in squat with pt quick to fatigue. Given band for bridge with clams as well. Improved hip strength bilateral with deficits still in abduction and extension bilateral. LTG#2 partially, LTG#3 Met.    PT Next Visit Plan Progress knee and hip strengthening in [facility] and stretching. Terminal knee exercises, hamstring stretching, lateral band walks        Problem List Patient Active Problem List   Diagnosis Date Noted  . Knee pain 02/19/2012  . Fibrocystic breast 02/13/2012  . Eczema 02/13/2012    Dorene Ar, PTA 08/16/2015, 5:30 PM  [place], [place], [address] Phone: [phone]   Fax:  [phone]  Name: ELEONOR OCON MRN: [id number] Date of Birth: 1971/04/30

## 2015-08-17 ENCOUNTER — Encounter: Payer: 59 | Admitting: Physical Therapy

## 2015-08-18 ENCOUNTER — Ambulatory Visit: Payer: 59 | Attending: Orthopaedic Surgery

## 2015-08-18 DIAGNOSIS — M6289 Other specified disorders of muscle: Secondary | ICD-10-CM | POA: Insufficient documentation

## 2015-08-18 DIAGNOSIS — M25561 Pain in right knee: Secondary | ICD-10-CM | POA: Diagnosis not present

## 2015-08-18 DIAGNOSIS — R6889 Other general symptoms and signs: Secondary | ICD-10-CM | POA: Diagnosis not present

## 2015-08-18 DIAGNOSIS — R29898 Other symptoms and signs involving the musculoskeletal system: Secondary | ICD-10-CM

## 2015-08-18 NOTE — Therapy (Signed)
[place], [place], [address] Phone: [phone]   Fax:  [phone]  Physical Therapy Treatment  Patient Details  Name: Sheila Obrien MRN: [id number] Date of Birth: [birth year] Referring Provider: Melrose Nakayama MD  Encounter Date: 08/18/2015      PT End of Session - 08/18/15 1628    Visit Number 7   Number of Visits 12   Date for PT Re-Evaluation 09/05/15   PT Start Time 0973   PT Stop Time 1640   PT Time Calculation (min) 55 min   Activity Tolerance Patient tolerated treatment well   Behavior During Therapy [facility] for tasks assessed/performed      Past Medical History  Diagnosis Date  . Anxiety     managed by Dr. Toy Care  . Depression     managed by Dr. Toy Care  . Ureteral reflux     s/p surgery  . Low back pain     intermittent problems with SI joint  . Eczema     Past Surgical History  Procedure Laterality Date  . Bilateral ureteral reimplantation  05/1990    Dr.Humphries  . Cesarean section  2004    There were no vitals filed for this visit.  Visit Diagnosis:  Right knee pain  Weakness of both hips  Activity intolerance      Subjective Assessment - 08/18/15 1628    Subjective No pain today. "It comes and goes".    Currently in Pain? No/denies   Pain Score 0-No pain                         [facility] Adult PT Treatment/Exercise - 08/18/15 0001    Knee/Hip Exercises: Stretches   Active Hamstring Stretch 3 reps;30 seconds   Knee/Hip Exercises: Aerobic   Recumbent Bike L4 x 5 mins   Knee/Hip Exercises: Machines for Strengthening   Cybex Leg Press single leg press 45 lbs 10 x 2  25 lbs x 10 reps   Knee/Hip Exercises: Standing   Lateral Step Up 2 sets;10 reps;Step Height: 4"   Step Down 2 sets;10 reps   Step Down Limitations 1 set lateral ( 4") , 1 set forward (2")   no pain.    Wall Squat --  with physioball attempted x 1 rep, so discontinued.    Other Standing  Knee Exercises TKE with theraband  20 x each   crepetis audible   Knee/Hip Exercises: Supine   Bridges with Ball Squeeze 10 reps  with core brace    Other Supine Knee/Hip Exercises bridge on physioball with knee flexion x 10 x 1    Modalities   Modalities Cryotherapy   Cryotherapy   Number Minutes Cryotherapy 10 Minutes   Cryotherapy Location Knee   Type of Cryotherapy Ice pack   Manual Therapy   Soft tissue mobilization STM to medial HS and patellar tendon.                   PT Short Term Goals - 08/02/15 1618    PT SHORT TERM GOAL #1   Title She will be I with inital HEP (08/15/2015)   Time 3   Period Weeks   Status Achieved   PT SHORT TERM GOAL #2   Title pt will be be able to verbalize and demonstrate techniques on how to reduce and R knee inflammation and pain via RICE and HEP (08/15/2015)   Time 3   Period  Weeks   Status Achieved           PT Long Term Goals - 08/16/15 1616    PT LONG TERM GOAL #1   Title pt will be I with all HEP as of last visit (09/05/2015)   Time 6   Period Weeks   Status On-going   PT LONG TERM GOAL #2   Title pt will improve bil hip abduction/ extension strength to >/=4+/5 to assist with prolonged standing/walking activities and promote efficient knee mechanics.    Time 6   Period Weeks   Status Partially Met   PT LONG TERM GOAL #3   Title pt will be able to ascend/ descend >/= 15 steps with </= 2/10 pain to assist with job related activities (09/05/2015)   Baseline 2/10 descending large hospital steps   Time 6   Period Weeks   Status Achieved   PT LONG TERM GOAL #4   Title pt will improve her FOTO score to >/= 68 to demonstrate improvement in function (09/05/2015)   Time 6   Period Weeks   Status On-going   PT LONG TERM GOAL #5   Title pt will be able resume working out at the gym with </= 2/10 pain in the R knee to assist with personal goal of working out and losing weight (09/05/2015)   Time 6   Period Weeks   Status  On-going               Plan - 08/18/15 1632    Clinical Impression Statement Crepetis with TKE with theraband noted, but without pain. Increased pain with PB squat on wall, so discontinued. Laxity noted with anterior drawer test of knee, but no pain. No pain with posterior drawer. Soreness at medial HS decreased with STM. Added bridges on Physioball with knee flexion for hamstring strengthening.    PT Next Visit Plan Progress knee and hip strengthening in [facility] and stretching. Terminal knee exercises, hamstring stretching, lateral band walks   PT Home Exercise Plan cont. hamstring and quad/ hip flexor  stretches    Consulted and Agree with Plan of Care Patient        Problem List Patient Active Problem List   Diagnosis Date Noted  . Knee pain 02/19/2012  . Fibrocystic breast 02/13/2012  . Eczema 02/13/2012    Sheila Obrien , PT  08/18/2015, 4:38 PM  [facility] [address] [place], [place], [address] Phone: [phone]   Fax:  [phone]  Name: Sheila Obrien MRN: [id number] Date of Birth: 03-13-1971

## 2015-08-22 ENCOUNTER — Ambulatory Visit: Payer: 59 | Admitting: Physical Therapy

## 2015-08-22 DIAGNOSIS — R6889 Other general symptoms and signs: Secondary | ICD-10-CM

## 2015-08-22 DIAGNOSIS — M6289 Other specified disorders of muscle: Secondary | ICD-10-CM | POA: Diagnosis not present

## 2015-08-22 DIAGNOSIS — R29898 Other symptoms and signs involving the musculoskeletal system: Secondary | ICD-10-CM

## 2015-08-22 DIAGNOSIS — M25561 Pain in right knee: Secondary | ICD-10-CM

## 2015-08-22 MED FILL — VELIVET 28 DAY TABLET: 0.1/0.125/0 | 84 days supply | Qty: 84 | Fill #2

## 2015-08-22 NOTE — Therapy (Signed)
[place], [place], [address] Phone: [phone]   Fax:  [phone]  Physical Therapy Treatment  Patient Details  Name: Sheila Obrien MRN: [id number] Date of Birth: 1970/08/11 Referring Provider: Melrose Nakayama MD  Encounter Date: 08/22/2015      PT End of Session - 08/22/15 1502    Visit Number 8   Number of Visits 12   Date for PT Re-Evaluation 09/05/15   PT Start Time 0300   PT Stop Time 0355   PT Time Calculation (min) 55 min      Past Medical History  Diagnosis Date  . Anxiety     managed by Dr. Toy Care  . Depression     managed by Dr. Toy Care  . Ureteral reflux     s/p surgery  . Low back pain     intermittent problems with SI joint  . Eczema     Past Surgical History  Procedure Laterality Date  . Bilateral ureteral reimplantation  05/1990    Dr.Humphries  . Cesarean section  2004    There were no vitals filed for this visit.  Visit Diagnosis:  Right knee pain  Weakness of both hips  Activity intolerance          [facility] PT Assessment - 08/22/15 0001    Observation/Other Assessments   Focus on Therapeutic Outcomes (FOTO)  32% limited improved from 43% limited                      [facility] Adult PT Treatment/Exercise - 08/22/15 0001    Knee/Hip Exercises: Stretches   Quad Stretch 3 reps;30 seconds;Other (comment)   Knee/Hip Exercises: Aerobic   Recumbent Bike L3 x 5 min   Knee/Hip Exercises: Machines for Strengthening   Cybex Leg Press single leg 45# x 10 55# x10   Knee/Hip Exercises: Standing   Functional Squat 10 reps;2 sets   Functional Squat Limitations with heels on 1/2 foam roller   Other Standing Knee Exercises LAteral band walks with green band in squat position around mat table 1/2 each way. x 2 each way   Knee/Hip Exercises: Supine   Bridges with Ball Squeeze 10 reps  with core brace    Bridges with Clamshell 10 reps;3 sets  green band   Single  Leg Bridge 10 reps;Both   Other Supine Knee/Hip Exercises bridge on physioball with knee flexion x 10 x 1                   PT Short Term Goals - 08/02/15 1618    PT SHORT TERM GOAL #1   Title She will be I with inital HEP (08/15/2015)   Time 3   Period Weeks   Status Achieved   PT SHORT TERM GOAL #2   Title pt will be be able to verbalize and demonstrate techniques on how to reduce and R knee inflammation and pain via RICE and HEP (08/15/2015)   Time 3   Period Weeks   Status Achieved           PT Long Term Goals - 08/22/15 1543    PT LONG TERM GOAL #1   Title pt will be I with all HEP as of last visit (09/05/2015)   Time 6   Period Weeks   Status On-going   PT LONG TERM GOAL #2   Title pt will improve bil hip abduction/ extension strength to >/=4+/5 to assist with prolonged  standing/walking activities and promote efficient knee mechanics.    Time 6   Period Weeks   Status Partially Met   PT LONG TERM GOAL #3   Title pt will be able to ascend/ descend >/= 15 steps with </= 2/10 pain to assist with job related activities (09/05/2015)   Status Achieved   PT LONG TERM GOAL #4   Title pt will improve her FOTO score to >/= 68 to demonstrate improvement in function (09/05/2015)   Status Achieved   PT LONG TERM GOAL #5   Title pt will be able resume working out at the gym with </= 2/10 pain in the R knee to assist with personal goal of working out and losing weight (09/05/2015)   Time 6   Period Weeks   Status On-going               Plan - 08/22/15 1539    Clinical Impression Statement Pt instructed in hip /knee strengthening with pain only at end range squats. FOTO score improved from 43% to 32% limited. Hip weakness still present left > right. LTG#4 MET.    PT Next Visit Plan Progress knee and hip strengthening in [facility] and stretching. Terminal knee exercises, hamstring stretching, lateral band walks        Problem List Patient Active Problem List    Diagnosis Date Noted  . Knee pain 02/19/2012  . Fibrocystic breast 02/13/2012  . Eczema 02/13/2012    Dorene Ar, PTA 08/22/2015, 3:48 PM  [place], [place], [address] Phone: [phone]   Fax:  [phone]  Name: TAHARI CLABAUGH MRN: [id number] Date of Birth: 1970/06/22    7

## 2015-08-24 ENCOUNTER — Ambulatory Visit: Payer: 59 | Admitting: Physical Therapy

## 2015-08-24 DIAGNOSIS — M25561 Pain in right knee: Secondary | ICD-10-CM | POA: Diagnosis not present

## 2015-08-24 DIAGNOSIS — R29898 Other symptoms and signs involving the musculoskeletal system: Secondary | ICD-10-CM

## 2015-08-24 DIAGNOSIS — M6289 Other specified disorders of muscle: Secondary | ICD-10-CM | POA: Diagnosis not present

## 2015-08-24 DIAGNOSIS — R6889 Other general symptoms and signs: Secondary | ICD-10-CM | POA: Diagnosis not present

## 2015-08-24 NOTE — Therapy (Signed)
[place], [place], [address] Phone: [phone]   Fax:  [phone]  Physical Therapy Treatment  Patient Details  Name: Sheila Obrien MRN: [id number] Date of Birth: July 10, 1970 Referring Provider: Melrose Nakayama MD  Encounter Date: 08/24/2015      PT End of Session - 08/24/15 1544    Visit Number 9   Number of Visits 12   Date for PT Re-Evaluation 09/05/15   PT Start Time 0302   PT Stop Time 0344   PT Time Calculation (min) 42 min      Past Medical History  Diagnosis Date  . Anxiety     managed by Dr. Toy Care  . Depression     managed by Dr. Toy Care  . Ureteral reflux     s/p surgery  . Low back pain     intermittent problems with SI joint  . Eczema     Past Surgical History  Procedure Laterality Date  . Bilateral ureteral reimplantation  05/1990    Dr.Humphries  . Cesarean section  2004    There were no vitals filed for this visit.  Visit Diagnosis:  Right knee pain  Weakness of both hips  Activity intolerance                       [facility] Adult PT Treatment/Exercise - 08/24/15 0001    Knee/Hip Exercises: Stretches   Active Hamstring Stretch 3 reps;30 seconds   Quad Stretch 3 reps;30 seconds;Other (comment)   Knee/Hip Exercises: Aerobic   Elliptical L3 Ramp 1 x 5 min   Knee/Hip Exercises: Machines for Strengthening   Cybex Leg Press bilateral 75# x20, 55# 15 x 2 single    Knee/Hip Exercises: Standing   Functional Squat 10 reps;2 sets   Functional Squat Limitations with heels on 1/2 foam roller   Other Standing Knee Exercises LAteral band walks with green band 40 ft x 2 each way then monster walks 52f x 4 with blue band    Knee/Hip Exercises: Supine   Bridges with Ball Squeeze 10 reps  with core brace    Bridges with Clamshell 10 reps;3 sets  blue band   Single Leg Bridge 10 reps;Both   Other Supine Knee/Hip Exercises bridge on physioball with knee flexion x 10  x 1                   PT Short Term Goals - 08/02/15 1618    PT SHORT TERM GOAL #1   Title She will be I with inital HEP (08/15/2015)   Time 3   Period Weeks   Status Achieved   PT SHORT TERM GOAL #2   Title pt will be be able to verbalize and demonstrate techniques on how to reduce and R knee inflammation and pain via RICE and HEP (08/15/2015)   Time 3   Period Weeks   Status Achieved           PT Long Term Goals - 08/22/15 1543    PT LONG TERM GOAL #1   Title pt will be I with all HEP as of last visit (09/05/2015)   Time 6   Period Weeks   Status On-going   PT LONG TERM GOAL #2   Title pt will improve bil hip abduction/ extension strength to >/=4+/5 to assist with prolonged standing/walking activities and promote efficient knee mechanics.    Time 6   Period Weeks   Status  Partially Met   PT LONG TERM GOAL #3   Title pt will be able to ascend/ descend >/= 15 steps with </= 2/10 pain to assist with job related activities (09/05/2015)   Status Achieved   PT LONG TERM GOAL #4   Title pt will improve her FOTO score to >/= 68 to demonstrate improvement in function (09/05/2015)   Status Achieved   PT LONG TERM GOAL #5   Title pt will be able resume working out at the gym with </= 2/10 pain in the R knee to assist with personal goal of working out and losing weight (09/05/2015)   Time 6   Period Weeks   Status On-going               Plan - 08/24/15 1551    Clinical Impression Statement Pt reports this is the first day that she has been painfree. Progressing toward LTG#5.  Challenged pt with lateral band walks and monster walks with blue band. Worked eccentric quads and hamstring/glutes with increased reps and good tolerance. No increased pain. Pt has one more visit scheduled and she will like be ready for discharge next visit.    PT Next Visit Plan Probable dc, check hip strength, goals, FOTO         Problem List Patient Active Problem List   Diagnosis  Date Noted  . Knee pain 02/19/2012  . Fibrocystic breast 02/13/2012  . Eczema 02/13/2012    Dorene Ar, PTA 08/24/2015, 3:53 PM  [place], [place], [address] Phone: [phone]   Fax:  [phone]  Name: Sheila Obrien MRN: [id number] Date of Birth: 26-Nov-1970

## 2015-08-30 ENCOUNTER — Ambulatory Visit: Payer: 59

## 2015-08-30 DIAGNOSIS — R29898 Other symptoms and signs involving the musculoskeletal system: Secondary | ICD-10-CM

## 2015-08-30 DIAGNOSIS — R6889 Other general symptoms and signs: Secondary | ICD-10-CM

## 2015-08-30 DIAGNOSIS — M6289 Other specified disorders of muscle: Secondary | ICD-10-CM | POA: Diagnosis not present

## 2015-08-30 DIAGNOSIS — M25561 Pain in right knee: Secondary | ICD-10-CM | POA: Diagnosis not present

## 2015-08-30 NOTE — Therapy (Signed)
[place], [place], [address] Phone: [phone]   Fax:  [phone]  Physical Therapy Treatment  and Discharge Summary   Patient Details  Name: Sheila Obrien MRN: [id number] Date of Birth: 06-Jan-1971 Referring Provider: Melrose Nakayama MD  Encounter Date: 08/30/2015      PT End of Session - 08/30/15 1612    Visit Number 10   Number of Visits 12   Date for PT Re-Evaluation 09/05/15   PT Start Time 1550   PT Stop Time 1630   PT Time Calculation (min) 40 min   Activity Tolerance Patient tolerated treatment well   Behavior During Therapy [facility] for tasks assessed/performed      Past Medical History  Diagnosis Date  . Anxiety     managed by Dr. Toy Care  . Depression     managed by Dr. Toy Care  . Ureteral reflux     s/p surgery  . Low back pain     intermittent problems with SI joint  . Eczema     Past Surgical History  Procedure Laterality Date  . Bilateral ureteral reimplantation  05/1990    Dr.Humphries  . Cesarean section  2004    There were no vitals filed for this visit.  Visit Diagnosis:  Right knee pain  Weakness of both hips  Activity intolerance      Subjective Assessment - 08/30/15 1555    Subjective Pt reports 90% improved since beginning PT. Pt reprots less pain, less often. Pt still occasionally has difficulty with stairs. Pt reports compliance with HEP. Pt has not yet returned to working out specifically jogging, p90x.    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 No/denies   Pain Score 0-No pain   Pain Location Knee   Pain Orientation Right            [facility] PT Assessment - 08/30/15 0001    Observation/Other Assessments   Focus on  Therapeutic Outcomes (FOTO)  28% limited   AROM   Right Knee Extension 0   Right Knee Flexion 140   Strength   Right Hip Flexion 5/5   Right Hip Extension 4+/5   Right Hip ABduction 4+/5   Right Hip ADduction 4+/5                     [facility] Adult PT Treatment/Exercise - 08/30/15 0001    Knee/Hip Exercises: Aerobic   Elliptical L3 Ramp 1 x 5 min   Knee/Hip Exercises: Machines for Strengthening   Cybex Leg Press single leg  55# 15 x 2 single    Knee/Hip Exercises: Standing   Functional Squat 10 reps;2 sets   Functional Squat Limitations with R heel back .    Other Standing Knee Exercises LAteral band walks with blue band 40 ft x 2 each way then monster walks 60f x 2 with blue band    Other Standing Knee Exercises Stairs x 15 steps   no pain                 PT Education - 08/30/15 1734    Education provided Yes   Education Details Objective improvements, goals achieved, Discharge, HEP to continue.    Person(s) Educated Patient   Methods  Explanation   Comprehension Verbalized understanding          PT Short Term Goals - 08/30/15 1602    PT SHORT TERM GOAL #1   Title She will be I with inital HEP (08/15/2015)   Time 3   Period Weeks   Status Achieved   PT SHORT TERM GOAL #2   Title pt will be be able to verbalize and demonstrate techniques on how to reduce and R knee inflammation and pain via RICE and HEP (08/15/2015)   Time 3   Period Weeks   Status Achieved           PT Long Term Goals - 08/30/15 1602    PT LONG TERM GOAL #1   Title pt will be I with all HEP as of last visit (09/05/2015)   Time 6   Period Weeks   Status Achieved   PT LONG TERM GOAL #2   Title pt will improve bil hip abduction/ extension strength to >/=4+/5 to assist with prolonged standing/walking activities and promote efficient knee mechanics.    Time 6   Period Weeks   Status Achieved   PT LONG TERM GOAL #3   Title pt will be able to ascend/ descend >/= 15 steps with  </= 2/10 pain to assist with job related activities (09/05/2015)   Time 6   Period Weeks   Status Achieved   PT LONG TERM GOAL #4   Title pt will improve her FOTO score to >/= 68 to demonstrate improvement in function (09/05/2015)   Time 6   Period Weeks   Status Achieved   PT LONG TERM GOAL #5   Title pt will be able resume working out at the gym with </= 2/10 pain in the R knee to assist with personal goal of working out and losing weight (09/05/2015)   Time 6   Period Weeks   Status Achieved               Plan - 08/30/15 1612    Clinical Impression Statement Pt has attended 10 visits of PT since eval for R knee pain. At this point, pt has achieved all STGs and LTGs goals and feels "90% improved" since beginning PT. R knee AROM has return to 0-140 pain free. Pt's strength has improved to WNL, and pt has been relatively pain- free over the past week.Pt is compliant with HEP and plans to continue HEP for continued strengthening. Therefore, pt will be discharged from outpt PT due to all goals met.     Consulted and Agree with Plan of Care Patient        Problem List Patient Active Problem List   Diagnosis Date Noted  . Knee pain 02/19/2012  . Fibrocystic breast 02/13/2012  . Eczema 02/13/2012   PHYSICAL THERAPY DISCHARGE SUMMARY  Visits from Start of Care: 10  Current functional level related to goals / functional outcomes: All goals achieved. See above   Remaining deficits: Has not yet returned to running.    Education / Equipment: Indep with advanced HEP.   Plan: Patient agrees to discharge.  Patient goals were met. Patient is being discharged due to meeting the stated rehab goals.  ?????       Dollene Cleveland, PT 08/30/2015, 5:59 PM  [facility] [address] [place], [place], [address] Phone: [phone]   Fax:  [phone]  Name: Sheila Obrien MRN: [id number] Date of Birth: 10-Mar-1971

## 2015-09-05 DIAGNOSIS — H52223 Regular astigmatism, bilateral: Secondary | ICD-10-CM | POA: Diagnosis not present

## 2015-09-05 DIAGNOSIS — H5212 Myopia, left eye: Secondary | ICD-10-CM | POA: Diagnosis not present

## 2015-10-19 MED FILL — BUPROPION HCL XL 300 MG TAB: 300 | 90 days supply | Qty: 90 | Fill #0

## 2015-10-19 MED FILL — ESCITALOPRAM 20 MG TABLET: 20 | 90 days supply | Qty: 135 | Fill #0

## 2015-11-15 MED FILL — VELIVET 28 DAY TABLET: 0.1/0.125/0 | 84 days supply | Qty: 84 | Fill #3

## 2015-11-29 DIAGNOSIS — R1033 Periumbilical pain: Secondary | ICD-10-CM | POA: Diagnosis not present

## 2015-11-29 DIAGNOSIS — R194 Change in bowel habit: Secondary | ICD-10-CM | POA: Diagnosis not present

## 2015-11-29 DIAGNOSIS — K219 Gastro-esophageal reflux disease without esophagitis: Secondary | ICD-10-CM | POA: Diagnosis not present

## 2015-11-29 DIAGNOSIS — R140 Abdominal distension (gaseous): Secondary | ICD-10-CM | POA: Diagnosis not present

## 2015-12-09 ENCOUNTER — Ambulatory Visit (INDEPENDENT_AMBULATORY_CARE_PROVIDER_SITE_OTHER): Payer: 59 | Admitting: Family Medicine

## 2015-12-09 ENCOUNTER — Encounter: Payer: Self-pay | Admitting: Family Medicine

## 2015-12-09 VITALS — BP 116/72 | HR 68 | Temp 99.9°F | Wt 162.2 lb

## 2015-12-09 DIAGNOSIS — J069 Acute upper respiratory infection, unspecified: Secondary | ICD-10-CM | POA: Diagnosis not present

## 2015-12-09 DIAGNOSIS — R059 Cough, unspecified: Secondary | ICD-10-CM

## 2015-12-09 DIAGNOSIS — R05 Cough: Secondary | ICD-10-CM

## 2015-12-09 MED ORDER — SULFAMETHOXAZOLE-TRIMETHOPRIM 800-160 MG PO TABS
1.0000 | ORAL_TABLET | Freq: Two times a day (BID) | ORAL | Status: DC
Start: 2015-12-09 — End: 2016-11-05

## 2015-12-09 NOTE — Patient Instructions (Signed)
Treat your symptoms as discussed. I am giving you a prescription for an antibiotic in case you develop high fever or worsening symptoms over the weekend. If not, do not get the prescription filled. If your symptoms do worsen and you end up taking the antibiotic then follow-up if not back to baseline after completing it.

## 2015-12-09 NOTE — Progress Notes (Signed)
Subjective: Chief Complaint  Patient presents with  . sinus infection    sinus pressure, drainage, ear pain- teeth pain, face pain,      Sheila Obrien is a 45 y.o. female who presents for possible sinus infection.  Symptoms include a 1 day history of sudden onset of facial pain, upper teeth aching, maxillary sinus pressure bilaterally, clear nasal drainage, ear pain, and dry cough . She does report history of mild seasonal allergies and has Zyrtec in the past. States this does not feel like allergies. Denies fever, chills, nausea, vomiting abdominal pain. Recent antibiotic use, Xifaxin, prescribed by Dr. Collene Mares. Completed a 3 day course on 12/02/2015. Also took a 10 day course of probiotics.   Past history is significant for no history of pneumonia or bronchitis. Patient is a non-smoker.  Using Dayquil, Nyquil, tylenol for symptoms.  Denies sick contacts.  No other aggravating or relieving factors.  No other c/o.  ROS as in subjective   Objective: Filed Vitals:   12/09/15 1101  BP: 116/72  Pulse: 68  Temp: 99.9 F (37.7 C)    General appearance: Alert, WD/WN, no distress                             Skin: warm, no rash                           Head: + maxillary and ethmoid sinus tenderness,                            Eyes: conjunctiva normal, corneas clear, PERRLA                            Ears: pearly TMs, external ear canals normal                          Nose: septum midline, turbinates swollen L>R, with erythema and clear discharge             Mouth/throat: MMM, tongue normal, mild pharyngeal erythema                           Neck: supple, no adenopathy, no thyromegaly, nontender                          Heart: RRR, normal S1, S2, no murmurs                         Lungs: CTA bilaterally, no wheezes, rales, or rhonchi      Assessment and Plan:  Acute upper respiratory infection  Cough  Discussed that she appears to have a viral infection vs bacterial sinus infection.  Normal virus progression discussed.  Written prescription given for Bactrim for her to hold on to in case of worsening symptoms such as fever >101, increased facial pain or purulent nasal drainage and since the weekend is here. Recommend symptomatic treatment for next 3-4 days. Tylenol OTC for fever and malaise.  Discussed symptomatic relief, nasal saline flush, and call or return if she ends up taking the antibiotic and is not back to baseline after completing.

## 2015-12-29 DIAGNOSIS — R194 Change in bowel habit: Secondary | ICD-10-CM | POA: Diagnosis not present

## 2015-12-29 DIAGNOSIS — R140 Abdominal distension (gaseous): Secondary | ICD-10-CM | POA: Diagnosis not present

## 2016-01-17 MED FILL — BUPROPION HCL XL 300 MG TAB: 300 | 90 days supply | Qty: 90 | Fill #1

## 2016-02-08 MED FILL — VELIVET 28 DAY TABLET: 0.1/0.125/0 | 28 days supply | Qty: 28 | Fill #4

## 2016-03-01 DIAGNOSIS — Z1231 Encounter for screening mammogram for malignant neoplasm of breast: Secondary | ICD-10-CM | POA: Diagnosis not present

## 2016-03-06 DIAGNOSIS — N6489 Other specified disorders of breast: Secondary | ICD-10-CM | POA: Diagnosis not present

## 2016-03-06 DIAGNOSIS — N6002 Solitary cyst of left breast: Secondary | ICD-10-CM | POA: Diagnosis not present

## 2016-03-06 MED FILL — VELIVET 28 DAY TABLET: 0.1/0.125/0 | 84 days supply | Qty: 84 | Fill #0

## 2016-03-30 MED FILL — ESCITALOPRAM 20 MG TABLET: 20 | 90 days supply | Qty: 135 | Fill #1

## 2016-04-05 DIAGNOSIS — Z01419 Encounter for gynecological examination (general) (routine) without abnormal findings: Secondary | ICD-10-CM | POA: Diagnosis not present

## 2016-04-05 DIAGNOSIS — Z124 Encounter for screening for malignant neoplasm of cervix: Secondary | ICD-10-CM | POA: Diagnosis not present

## 2016-04-05 DIAGNOSIS — Z1389 Encounter for screening for other disorder: Secondary | ICD-10-CM | POA: Diagnosis not present

## 2016-04-05 DIAGNOSIS — Z3041 Encounter for surveillance of contraceptive pills: Secondary | ICD-10-CM | POA: Diagnosis not present

## 2016-04-05 DIAGNOSIS — Z6826 Body mass index (BMI) 26.0-26.9, adult: Secondary | ICD-10-CM | POA: Diagnosis not present

## 2016-04-05 DIAGNOSIS — Z1151 Encounter for screening for human papillomavirus (HPV): Secondary | ICD-10-CM | POA: Diagnosis not present

## 2016-04-05 DIAGNOSIS — N760 Acute vaginitis: Secondary | ICD-10-CM | POA: Diagnosis not present

## 2016-04-05 DIAGNOSIS — N898 Other specified noninflammatory disorders of vagina: Secondary | ICD-10-CM | POA: Diagnosis not present

## 2016-04-05 DIAGNOSIS — Z130 Encounter for screening for diseases of the blood and blood-forming organs and certain disorders involving the immune mechanism: Secondary | ICD-10-CM | POA: Diagnosis not present

## 2016-04-05 DIAGNOSIS — B373 Candidiasis of vulva and vagina: Secondary | ICD-10-CM | POA: Diagnosis not present

## 2016-04-06 DIAGNOSIS — Z124 Encounter for screening for malignant neoplasm of cervix: Secondary | ICD-10-CM | POA: Diagnosis not present

## 2016-04-06 DIAGNOSIS — R87610 Atypical squamous cells of undetermined significance on cytologic smear of cervix (ASC-US): Secondary | ICD-10-CM | POA: Diagnosis not present

## 2016-04-06 LAB — HM PAP SMEAR

## 2016-04-06 LAB — RESULTS CONSOLE HPV: CHL HPV: NEGATIVE

## 2016-04-06 MED FILL — FLUCONAZOLE 200 MG TABLET: 200 | 9 days supply | Qty: 3 | Fill #0

## 2016-04-18 ENCOUNTER — Encounter: Payer: Self-pay | Admitting: *Deleted

## 2016-04-19 MED FILL — BUPROPION HCL XL 300 MG TAB: 300 | 90 days supply | Qty: 90 | Fill #2

## 2016-05-28 MED FILL — VELIVET 28 DAY TABLET: 0.1/0.125/0 | 84 days supply | Qty: 84 | Fill #0

## 2016-07-23 MED FILL — ESCITALOPRAM 20 MG TABLET: 20 | 90 days supply | Qty: 135 | Fill #2

## 2016-07-23 MED FILL — BUPROPION HCL XL 300 MG TAB: 300 | 30 days supply | Qty: 30 | Fill #3

## 2016-08-21 MED FILL — VELIVET 28 DAY TABLET: 0.1/0.125/0 | 84 days supply | Qty: 84 | Fill #1

## 2016-08-21 MED FILL — BUPROPION HCL XL 300 MG TAB: 300 | 30 days supply | Qty: 30 | Fill #4

## 2016-09-24 MED FILL — BUPROPION HCL XL 300 MG TAB: 300 | 90 days supply | Qty: 90 | Fill #5

## 2016-11-01 ENCOUNTER — Telehealth: Payer: Self-pay | Admitting: *Deleted

## 2016-11-01 MED ORDER — ESCITALOPRAM OXALATE 20 MG PO TABS: 20.0000 mg | ORAL_TABLET | Freq: Every day | ORAL | 0 refills | Status: DC

## 2016-11-01 MED FILL — ESCITALOPRAM 20 MG TABLET: 20 | 30 days supply | Qty: 30 | Fill #0

## 2016-11-01 NOTE — Telephone Encounter (Signed)
Patient coming in Monday at 2:45pm and sent rx for #30 to [place].

## 2016-11-01 NOTE — Telephone Encounter (Signed)
Ok to work into the [employer] today (have her actually get here a little earlier--I'm going to try and be quick with the one before her). If she prefers to come Monday, okay to rx for #30 of her lexapro--if she knows I am willing to do this, she may not feel as rushed to get in today.

## 2016-11-01 NOTE — Telephone Encounter (Signed)
Patient called and is on 20mg  of Lexapro and has been for many many years and she just found out that Dr Toy Care no longer takes her [employer] and it will over $100 for OV. She wants to know if you would take over rxing this for her? She has been out this med for 2 days now and is very nervous about missing the last 2 days-would like to come in today of you are willing to rx but I don't believe you have an appt for her(plenty on Monday) Please advise.

## 2016-11-04 NOTE — Progress Notes (Signed)
Chief Complaint  Patient presents with  . Advice Only    consult for lexapro.    Patient presents to discuss her Lexapro. She was given a refill last week. Previously was under the care of Dr. Toy Care, who no longer accepts her insurance. She has been on Lexapro for many years for treatment of depression and anxiety.  Started medication at age 46.  She recalls having had 3 mild depressive episodes.  H/o some anxiety, but mostly depression. She recalls Dr. Toy Care stating she would need to be on this long-term. She denies any side effects. She currently denies any symptoms of depression or anxiety, doing well, and hesitant to make any changes.  H/o abdominal bloating. She saw Dr. Mallie Mussel resolved after stopping artificial sweeteners.  Did Whole 30, lost 15#, but regained it. Is inconsistent with following proper diet.  She has been exercising fairly regularly, going to 4:30 exercise classes through [facility] (ie spin, others)  Hot flashes have been more frequent over the last month. She is on OCP's, and these are happening regularly, not only when on placebo tablets.  She has also noted some hair loss x 1.5 years Allergies--flared badly. Better now.  Zyrtec helps., +fatigue. She is taking zyrtec at bedtime.  She hasn't been seen by me since 04/2014. PMH, PSH, SH and FH reviewed and updated  Outpatient Encounter Prescriptions as of 11/05/2016  Medication Sig Note  . buPROPion (WELLBUTRIN XL) 300 MG 24 hr tablet Take 1 tablet (300 mg total) by mouth daily.   . cetirizine (ZYRTEC) 10 MG chewable tablet Chew 10 mg by mouth daily. 11/05/2016: Uses prn allergies (taking regularly for the last month)  . escitalopram (LEXAPRO) 20 MG tablet Take 1 tablet (20 mg total) by mouth daily.   . VELIVET 0.1/0.125/0.15 -0.025 MG tablet Take 1 tablet by mouth daily.  06/21/2015: Received from: External Pharmacy  . [DISCONTINUED] buPROPion (WELLBUTRIN XL) 300 MG 24 hr tablet Take 300 mg by mouth daily.  06/21/2015:  Received from: External Pharmacy  . [DISCONTINUED] escitalopram (LEXAPRO) 20 MG tablet Take 1 tablet (20 mg total) by mouth daily.   . [DISCONTINUED] sulfamethoxazole-trimethoprim (BACTRIM DS,SEPTRA DS) 800-160 MG tablet Take 1 tablet by mouth 2 (two) times daily.    No facility-administered encounter medications on file as of 11/05/2016.    Allergies  Allergen Reactions  . Penicillins Hives   ROS: no fever, chills, headaches, dizziness, chest pain, palpitations, heartburn, abdominal pain, bowel changes (bloating resolved with dietary change), urinary complaints, depression, anxiety.  Denies bleeding, bruising, rash.     PHYSICAL EXAM:  BP 110/66 (BP Location: Left Arm, Patient Position: Sitting, Cuff Size: Normal)   Pulse 72   Ht 5\' 4"  (1.626 m)   Wt 167 lb 3.2 oz (75.8 kg)   LMP 10/09/2016 (Approximate)   BMI 28.70 kg/m   Wt Readings from Last 3 Encounters:  12/09/15 162 lb 3.2 oz (73.6 kg)  06/21/15 167 lb (75.8 kg)  04/21/14 158 lb (71.7 kg)   Well developed, pleasant female in no distress HEENT: conjunctiva and sclera are clear, OP clear.  Nasal mucosa is mildly edematous, no erythema Neck: no lymphadenopathy, thyromegaly or mass Heart: regular rate and rhythm Lungs: clear bilaterally Abdomen: soft, nontender, no organomegaly or mass Extremities: no edema Psych: normal mood, affect, hygiene and grooming Neuro: alert and oriented, cranial nerves intact, normal strength, gait   ASSESSMENT/PLAN:  Depression, major, in remission ([place]) - Plan: buPROPion (WELLBUTRIN XL) 300 MG 24 hr tablet, escitalopram (LEXAPRO) 20  MG tablet  Generalized anxiety disorder - Plan: buPROPion (WELLBUTRIN XL) 300 MG 24 hr tablet, escitalopram (LEXAPRO) 20 MG tablet  Hair loss - Plan: TSH  Other fatigue - Plan: TSH, VITAMIN D 25 Hydroxy (Vit-D Deficiency, Fractures)  Seasonal allergic rhinitis, unspecified trigger - may contribute to fatigue, as can zyrtec.  If ongoing fatigue, consider  trial of Allegra   Depression/anxiety discussed in detail.  Discussed potential to decrease dose (with plans to eventually try tapering down/off further) at some point in future.  She would like to stay on same medications at this time.  30 min visit, more than 1/2 spent counseling.   Continue your lexapro and wellbutrin. Continue to work on diet and exercise for weight loss. In general, 1000 IU of Vitamin D3 is recommended daily (either through a multivitamin or separate D supplement).  I will let you know if anything different is needed based on your test results.

## 2016-11-05 ENCOUNTER — Ambulatory Visit (INDEPENDENT_AMBULATORY_CARE_PROVIDER_SITE_OTHER): Payer: 59 | Admitting: Family Medicine

## 2016-11-05 ENCOUNTER — Encounter: Payer: Self-pay | Admitting: Family Medicine

## 2016-11-05 VITALS — BP 110/66 | HR 72 | Ht 64.0 in | Wt 167.2 lb

## 2016-11-05 DIAGNOSIS — E559 Vitamin D deficiency, unspecified: Secondary | ICD-10-CM | POA: Diagnosis not present

## 2016-11-05 DIAGNOSIS — J302 Other seasonal allergic rhinitis: Secondary | ICD-10-CM | POA: Diagnosis not present

## 2016-11-05 DIAGNOSIS — L659 Nonscarring hair loss, unspecified: Secondary | ICD-10-CM

## 2016-11-05 DIAGNOSIS — F411 Generalized anxiety disorder: Secondary | ICD-10-CM | POA: Diagnosis not present

## 2016-11-05 DIAGNOSIS — F325 Major depressive disorder, single episode, in full remission: Secondary | ICD-10-CM | POA: Diagnosis not present

## 2016-11-05 DIAGNOSIS — R5383 Other fatigue: Secondary | ICD-10-CM

## 2016-11-05 MED ORDER — BUPROPION HCL ER (XL) 300 MG PO TB24: 300.0000 mg | ORAL_TABLET | Freq: Every day | ORAL | 1 refills | Status: DC

## 2016-11-05 MED ORDER — ESCITALOPRAM OXALATE 20 MG PO TABS: 20.0000 mg | ORAL_TABLET | Freq: Every day | ORAL | 1 refills | Status: DC

## 2016-11-05 NOTE — Patient Instructions (Signed)
Continue your lexapro and wellbutrin. Continue to work on diet and exercise for weight loss. In general, 1000 IU of Vitamin D3 is recommended daily (either through a multivitamin or separate D supplement).  I will let you know if anything different is needed based on your test results.

## 2016-11-06 DIAGNOSIS — E559 Vitamin D deficiency, unspecified: Secondary | ICD-10-CM | POA: Insufficient documentation

## 2016-11-06 LAB — VITAMIN D 25 HYDROXY (VIT D DEFICIENCY, FRACTURES): Vit D, 25-Hydroxy: 22 ng/mL — ABNORMAL LOW (ref 30–100)

## 2016-11-06 LAB — TSH: TSH: 3.03 mIU/L

## 2016-11-06 MED ORDER — VITAMIN D (ERGOCALCIFEROL) 1.25 MG (50000 UNIT) PO CAPS: 50000.0000 [IU] | ORAL_CAPSULE | ORAL | 0 refills | Status: DC

## 2016-11-06 MED FILL — VIT D2 1.25 MG (50,000 UNIT: 1.25 MG | 84 days supply | Qty: 12 | Fill #0

## 2016-11-06 NOTE — Addendum Note (Signed)
Addended by: Rita Ohara on: 11/06/2016 07:46 AM   Modules accepted: Orders

## 2016-11-14 MED FILL — VELIVET 28 DAY TABLET: 0.1/0.125/0 | 84 days supply | Qty: 84 | Fill #2

## 2016-11-30 MED FILL — ESCITALOPRAM 20 MG TABLET: 20 | 90 days supply | Qty: 90 | Fill #0

## 2016-12-11 NOTE — Progress Notes (Signed)
Chief Complaint  Patient presents with  . Annual Exam    fasting annual exam, no pap sees GYN. Will have checked at optho. For years (5+) has been having spells where she gets lightheaded and nauseous with a dull HA, where she has to lay down. Monday this happened and she had to pull over and vomit.     Sheila Obrien is a 46 y.o. female who presents for a complete physical.  She was recently seen for refill on Lexapro, which she has been taking for many years for treatment of depression and anxiety. She continues to do well on this, without any side effects.  For a long time she gets episodes of lightheadedness with nausea, not associated with a headache.  Feels like morning sickness.  It seems to be more in the mornings, but can happen any time of day.  She just figured this was normal, going on for years.  She had a slight headache 3 days ago. 2 days ago she had lightheadedness, nauseated, had to pull over and vomited 3x.  Felt better and was able to work.  No diarrhea or other GI problems.  She hadn't eaten anything yet that morning.  No h/o migraines, but her sister and son have them.  She only had a slight headache that day, dull, whole top of head, bilateral, very dull. Denies any numbness, tingling or other neurologic symptoms.  She reported some fatigue at her last visit, as well as some hot flashes and hair loss.  Thyroid test was normal. She was found to have low vitamin D, level of 22. She is currently taking 12 week course of prescription vitamin D. She also stopped the zyrtec, and her energy is much better.  H/o abdominal bloating. She saw Dr. Mallie Mussel resolved after stopping artificial sweeteners.  She had a chronic cough, which she relates to reflux.  She doesn't cough as much when on Whole 30 diet.  She no longer needs any OTC meds (took either prilosec or zantac, can't recall).  She is back on Whole 30 diet, doing it 80/20.  She lost 5# since her last visit.  She continues  with regular exercise.  H/o R knee pain. She had physical therapy, and is much better.  She only hs intermittent symptoms, but avoids running. MRI 06/2015 showed: IMPRESSION: 1. 7.6 x 12.3 mm area of full-thickness cartilage loss of the lateral femoral condyle along the posterior weight-bearing surface. 2. Small area of high-grade partial-thickness cartilage loss of the medial patellar facet with subchondral reactive marrow change.  Immunization History  Administered Date(s) Administered  . Influenza Split 03/18/2013   She believes tetanus is UTD through employee health, believes it was within 3-4 years. She gets flu shots annually.  Last Pap smear:  Through GYN (sees Dr. Willis Modena, last seen in Spring 2018, no pap done) Last mammogram: yearly through [facility] (report sent to Dr. Willis Modena, not to [place]) Last colonoscopy: never  Last DEXA: never  Dentist: twice yearly  Ophtho: yearly, wears contacts Exercise: classes 3-4x/week (spin, body pump, pump and sculpt, step) Lipids: Lab Results  Component Value Date   CHOL 203 (H) 07/23/2013   HDL 72 07/23/2013   LDLCALC 111 (H) 07/23/2013   TRIG 99 07/23/2013   CHOLHDL 2.8 07/23/2013  thinks it may be higher now that she is on Whole30--eating more eggs, meat.  Past Medical History:  Diagnosis Date  . Anxiety   . Depression   . Eczema   . Low back  pain    intermittent problems with SI joint  . Ureteral reflux    s/p surgery    Past Surgical History:  Procedure Laterality Date  . bilateral ureteral reimplantation  05/1990   Dr.Humphries  . CESAREAN SECTION  2004    Social History   Social History  . Marital status: Married    Spouse name: N/A  . Number of children: 2  . Years of education: N/A   Occupational History  . 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 Smoking status: Former Smoker    Quit date: 06/18/1996  . Smokeless tobacco: Never Used  . Alcohol use Yes     Comment: 2-3 drinks per  month.  . Drug use: No  . Sexual activity: Yes    Partners: Male    Birth control/ protection: Pill   Other Topics Concern  . Not on file   Social History Narrative   Lives with husband, 2 sons. 1 dog Hadley Pen)    Family History  Problem Relation Age of Onset  . Alcohol abuse Father   . Hypertension Father   . Depression Father   . Glaucoma Father   . Migraines Sister   . Migraines Son   . Cancer Other        colon cancer  . Diabetes Neg Hx   . Heart disease Neg Hx     Outpatient Encounter Prescriptions as of 12/12/2016  Medication Sig Note  . buPROPion (WELLBUTRIN XL) 300 MG 24 hr tablet Take 1 tablet (300 mg total) by mouth daily.   Marland Kitchen escitalopram (LEXAPRO) 20 MG tablet Take 1 tablet (20 mg total) by mouth daily.   . VELIVET 0.1/0.125/0.15 -0.025 MG tablet Take 1 tablet by mouth daily.  06/21/2015: Received from: External Pharmacy  . Vitamin D, Ergocalciferol, (DRISDOL) 50000 units CAPS capsule Take 1 capsule (50,000 Units total) by mouth every 7 (seven) days.   . [DISCONTINUED] cetirizine (ZYRTEC) 10 MG chewable tablet Chew 10 mg by mouth daily. 11/05/2016: Uses prn allergies (taking regularly for the last month)   No facility-administered encounter medications on file as of 12/12/2016.     Allergies  Allergen Reactions  . Penicillins Hives    ROS: The patient denies anorexia, fever, vision changes, decreased hearing, ear pain, sore throat, chest pain, palpitations, dizziness, syncope, dyspnea on exertion, cough, swelling,diarrhea, constipation, abdominal pain, melena, hematochezia, indigestion/heartburn, hematuria, incontinence, dysuria, irregular menstrual cycles, vaginal discharge, or odor, genital lesions, numbness, tingling, weakness, tremor, suspicious skin lesions, depression, anxiety, abnormal bleeding/bruising, or enlarged lymph nodes.  Chronic hearing loss L ear since childhood. No tinnitus.  H/o Bloating/gas, resolved after eliminating artificial sweeteners, rarely  recurs eczma on hands, intermittent. Not currently coughing, but has episodes per HPI. Intentional weight loss since last visit. Headache and nausea per HPI, this week and intermittent lightheadedness with nausea.   PHYSICAL EXAM:  BP 102/70 (BP Location: Left Arm, Patient Position: Sitting, Cuff Size: Normal)   Pulse 76   Ht '5\' 4"'  (1.626 m)   Wt 161 lb 9.6 oz (73.3 kg)   LMP 11/28/2016 (Approximate)   BMI 27.74 kg/m   Wt Readings from Last 3 Encounters:  12/12/16 161 lb 9.6 oz (73.3 kg)  11/05/16 167 lb 3.2 oz (75.8 kg)  12/09/15 162 lb 3.2 oz (73.6 kg)    General Appearance:  Alert, cooperative, no distress, appears stated age   Head:  Normocephalic, without obvious abnormality, atraumatic   Eyes:  PERRL, conjunctiva/corneas clear, EOM's  intact, fundi benign   Ears:  Normal TM's and external ear canals   Nose:  Nares normal, mucosa normal, no drainage or sinus tenderness   Throat:  Lips, mucosa, and tongue normal; teeth and gums normal   Neck:  Supple, no lymphadenopathy; thyroid: no enlargement/tenderness/nodules; no carotid bruit or JVD   Back:  Spine nontender, no curvature, ROM normal, no CVA tenderness   Lungs:  Clear to auscultation bilaterally without wheezes, rales or ronchi; respirations unlabored   Chest Wall:  No tenderness or deformity   Heart:  Regular rate and rhythm, S1 and S2 normal, no murmur, rub or gallop   Breast Exam:  Deferred to GYN  Abdomen:  Soft, non-tender, nondistended, normoactive bowel sounds,  no masses, no hepatosplenomegaly   Genitalia:  Deferred to GYN      Extremities:  No clubbing, cyanosis or edema.    Pulses:  2+ and symmetric all extremities   Skin:  Skin color, texture, turgor normal, no lesions  Lymph nodes:  Cervical, supraclavicular, and axillary nodes normal   Neurologic:  CNII-XII intact, normal strength, sensation and gait; reflexes 2+ and symmetric throughout                                   Psych: Normal mood, affect,  hygiene and grooming.    ASSESSMENT/PLAN:  Annual physical exam - Plan: Comprehensive metabolic panel, CBC with Differential/Platelet, Lipid panel, POCT Urinalysis Dipstick  Vitamin D deficiency  Lightheadedness - Ddx reviewed--check labs, stay well hydrated. f/u if persists/worsens - Plan: CBC with Differential/Platelet  Non-intractable vomiting with nausea, unspecified vomiting type - ddx reviewed, including reflux - Plan: Comprehensive metabolic panel, CBC with Differential/Platelet    CBC, c-met, lipid  Discussed monthly self breast exams and yearly mammograms; at least 30 minutes of aerobic activity at least 5 days/week, weight-bearing exercise at least 2x/week; proper sunscreen use reviewed; healthy diet, including goals of calcium and vitamin D intake and alcohol recommendations (less than or equal to 1 drink/day) reviewed; regular seatbelt use; changing batteries in smoke detectors.  Immunization recommendations discussed--continue yearly flu shots.  Shingrix at 50.  Colonoscopy recommendations reviewed--age 50  Get immunizations from employee health. Will have [facility] send next mammogram to [place] as well as to GYN

## 2016-12-12 ENCOUNTER — Encounter: Payer: Self-pay | Admitting: Family Medicine

## 2016-12-12 ENCOUNTER — Ambulatory Visit (INDEPENDENT_AMBULATORY_CARE_PROVIDER_SITE_OTHER): Payer: 59 | Admitting: Family Medicine

## 2016-12-12 VITALS — BP 102/70 | HR 76 | Ht 64.0 in | Wt 161.6 lb

## 2016-12-12 DIAGNOSIS — E559 Vitamin D deficiency, unspecified: Secondary | ICD-10-CM

## 2016-12-12 DIAGNOSIS — R42 Dizziness and giddiness: Secondary | ICD-10-CM

## 2016-12-12 DIAGNOSIS — Z0000 Encounter for general adult medical examination without abnormal findings: Secondary | ICD-10-CM | POA: Diagnosis not present

## 2016-12-12 DIAGNOSIS — R112 Nausea with vomiting, unspecified: Secondary | ICD-10-CM | POA: Diagnosis not present

## 2016-12-12 LAB — CBC WITH DIFFERENTIAL/PLATELET
BASOS PCT: 0 %
Basophils Absolute: 0 cells/uL (ref 0–200)
EOS ABS: 47 {cells}/uL (ref 15–500)
Eosinophils Relative: 1 %
HCT: 40.5 % (ref 35.0–45.0)
HEMOGLOBIN: 13.3 g/dL (ref 11.7–15.5)
LYMPHS ABS: 1316 {cells}/uL (ref 850–3900)
Lymphocytes Relative: 28 %
MCH: 31.4 pg (ref 27.0–33.0)
MCHC: 32.8 g/dL (ref 32.0–36.0)
MCV: 95.7 fL (ref 80.0–100.0)
MONOS PCT: 7 %
MPV: 9.7 fL (ref 7.5–12.5)
Monocytes Absolute: 329 cells/uL (ref 200–950)
NEUTROS ABS: 3008 {cells}/uL (ref 1500–7800)
Neutrophils Relative %: 64 %
Platelets: 230 10*3/uL (ref 140–400)
RBC: 4.23 MIL/uL (ref 3.80–5.10)
RDW: 13.1 % (ref 11.0–15.0)
WBC: 4.7 10*3/uL (ref 4.0–10.5)

## 2016-12-12 LAB — POCT URINALYSIS DIPSTICK
Bilirubin, UA: NEGATIVE
GLUCOSE UA: NEGATIVE
KETONES UA: NEGATIVE
Leukocytes, UA: NEGATIVE
Nitrite, UA: NEGATIVE
Protein, UA: NEGATIVE
Urobilinogen, UA: NEGATIVE E.U./dL — AB
pH, UA: 6 (ref 5.0–8.0)

## 2016-12-12 NOTE — Patient Instructions (Addendum)
  HEALTH MAINTENANCE RECOMMENDATIONS:  It is recommended that you get at least 30 minutes of aerobic exercise at least 5 days/week (for weight loss, you may need as much as 60-90 minutes). This can be any activity that gets your heart rate up. This can be divided in 10-15 minute intervals if needed, but try and build up your endurance at least once a week.  Weight bearing exercise is also recommended twice weekly.  Eat a healthy diet with lots of vegetables, fruits and fiber.  "Colorful" foods have a lot of vitamins (ie green vegetables, tomatoes, red peppers, etc).  Limit sweet tea, regular sodas and alcoholic beverages, all of which has a lot of calories and sugar.  Up to 1 alcoholic drink daily may be beneficial for women (unless trying to lose weight, watch sugars).  Drink a lot of water.  Calcium recommendations are 1200-1500 mg daily (1500 mg for postmenopausal women or women without ovaries), and vitamin D 1000 IU daily.  This should be obtained from diet and/or supplements (vitamins), and calcium should not be taken all at once, but in divided doses.  Monthly self breast exams and yearly mammograms for women over the age of 75 is recommended.  Sunscreen of at least SPF 30 should be used on all sun-exposed parts of the skin when outside between the hours of 10 am and 4 pm (not just when at beach or pool, but even with exercise, golf, tennis, and yard work!)  Use a sunscreen that says "broad spectrum" so it covers both UVA and UVB rays, and make sure to reapply every 1-2 hours.  Remember to change the batteries in your smoke detectors when changing your clock times in the spring and fall.  Use your seat belt every time you are in a car, and please drive safely and not be distracted with cell phones and texting while driving.  Remember to start either a multivitamin with 1000 IU of vitamin D vs a separate vitamin D3 1000 IU daily, to take longterm.

## 2016-12-13 LAB — LIPID PANEL
CHOL/HDL RATIO: 3.3 ratio (ref <5.0)
CHOLESTEROL: 225 mg/dL — AB (ref <200)
HDL: 69 mg/dL (ref >50)
LDL Cholesterol: 134 mg/dL — ABNORMAL HIGH (ref <100)
Triglycerides: 108 mg/dL (ref <150)
VLDL: 22 mg/dL (ref <30)

## 2016-12-13 LAB — COMPREHENSIVE METABOLIC PANEL
ALT: 11 U/L (ref 6–29)
AST: 12 U/L (ref 10–35)
Albumin: 3.9 g/dL (ref 3.6–5.1)
Alkaline Phosphatase: 43 U/L (ref 33–115)
BILIRUBIN TOTAL: 0.6 mg/dL (ref 0.2–1.2)
BUN: 13 mg/dL (ref 7–25)
CALCIUM: 8.8 mg/dL (ref 8.6–10.2)
CHLORIDE: 108 mmol/L (ref 98–110)
CO2: 23 mmol/L (ref 20–31)
Creat: 0.87 mg/dL (ref 0.50–1.10)
GLUCOSE: 82 mg/dL (ref 65–99)
POTASSIUM: 4.1 mmol/L (ref 3.5–5.3)
Sodium: 139 mmol/L (ref 135–146)
Total Protein: 6.4 g/dL (ref 6.1–8.1)

## 2016-12-14 ENCOUNTER — Telehealth: Payer: Self-pay | Admitting: Family Medicine

## 2016-12-14 NOTE — Telephone Encounter (Signed)
Immunization records received from [facility] and sent back in  Dr. Johnsie Kindred folder for review

## 2016-12-18 ENCOUNTER — Telehealth: Payer: Self-pay

## 2016-12-18 NOTE — Telephone Encounter (Signed)
Immunizations placed in your folder from [employer]. Sheila Obrien

## 2016-12-24 MED FILL — BUPROPION HCL XL 300 MG TAB: 300 | 90 days supply | Qty: 90 | Fill #0

## 2017-01-04 DIAGNOSIS — H5212 Myopia, left eye: Secondary | ICD-10-CM | POA: Diagnosis not present

## 2017-01-04 DIAGNOSIS — H52223 Regular astigmatism, bilateral: Secondary | ICD-10-CM | POA: Diagnosis not present

## 2017-02-07 MED FILL — VELIVET 28 DAY TABLET: 0.1/0.125/0 | 84 days supply | Qty: 84 | Fill #3

## 2017-03-01 MED FILL — ESCITALOPRAM 20 MG TABLET: 20 | 90 days supply | Qty: 90 | Fill #1

## 2017-03-18 MED FILL — BUPROPION HCL XL 300 MG TAB: 300 | 90 days supply | Qty: 90 | Fill #1

## 2017-03-26 DIAGNOSIS — Z1231 Encounter for screening mammogram for malignant neoplasm of breast: Secondary | ICD-10-CM | POA: Diagnosis not present

## 2017-03-26 LAB — HM MAMMOGRAPHY

## 2017-04-01 ENCOUNTER — Encounter: Payer: Self-pay | Admitting: Family Medicine

## 2017-04-29 MED FILL — VELIVET 28 DAY TABLET: 0.1/0.125/0 | 84 days supply | Qty: 84 | Fill #0

## 2017-05-30 ENCOUNTER — Other Ambulatory Visit: Payer: Self-pay | Admitting: Family Medicine

## 2017-05-30 DIAGNOSIS — F325 Major depressive disorder, single episode, in full remission: Secondary | ICD-10-CM

## 2017-05-30 DIAGNOSIS — F411 Generalized anxiety disorder: Secondary | ICD-10-CM

## 2017-05-30 MED FILL — ESCITALOPRAM 20 MG TABLET: 20 | 90 days supply | Qty: 90 | Fill #0

## 2017-05-30 MED FILL — BUPROPION HCL XL 300 MG TAB: 300 | 90 days supply | Qty: 90 | Fill #0

## 2017-06-26 ENCOUNTER — Encounter: Payer: 59 | Admitting: Family Medicine

## 2017-08-30 MED FILL — BUPROPION HCL XL 300 MG TAB: 300 | 90 days supply | Qty: 90 | Fill #1

## 2017-08-30 MED FILL — ESCITALOPRAM 20 MG TABLET: 20 | 90 days supply | Qty: 90 | Fill #1

## 2017-09-05 ENCOUNTER — Encounter: Payer: Self-pay | Admitting: Sports Medicine

## 2017-09-05 ENCOUNTER — Ambulatory Visit: Payer: 59 | Admitting: Sports Medicine

## 2017-09-05 DIAGNOSIS — Q667 Congenital pes cavus, unspecified foot: Secondary | ICD-10-CM | POA: Insufficient documentation

## 2017-09-05 DIAGNOSIS — M722 Plantar fascial fibromatosis: Secondary | ICD-10-CM | POA: Diagnosis not present

## 2017-09-05 NOTE — Assessment & Plan Note (Signed)
Arch strap  Sports insoles adjusted with scaphoid pads  PF rehab program - HO given  Digiovanni stretches  With cavus foot may need support

## 2017-09-05 NOTE — Assessment & Plan Note (Signed)
We will try scaphoid padding and insoles  May well need custom orthotic as do many of our cavus foot patients

## 2017-09-05 NOTE — Progress Notes (Signed)
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

## 2017-09-26 ENCOUNTER — Encounter: Payer: Self-pay | Admitting: Sports Medicine

## 2017-09-26 ENCOUNTER — Ambulatory Visit: Payer: 59 | Admitting: Sports Medicine

## 2017-09-26 DIAGNOSIS — M722 Plantar fascial fibromatosis: Secondary | ICD-10-CM

## 2017-09-26 NOTE — Progress Notes (Signed)
   HPI  CC: Left plantar fasciitis Patient is here regarding her left-sided plantar fasciitis.  She was last seen on 09/05/17 for this issue.  At that time she was given green shoe inserts with scaphoid pads.  She states that she has had some improvement in her symptoms since receiving these.  She continues to have pain that is worse first thing in the morning.  It is located to the plantar aspect of her calcaneus.  Pain is described as sharp and aching.  Pain gradually improves within an hour of first waking up.  She denies any trauma, injury, or event which may have caused this pain.  No weakness, numbness, or paresthesias.  Medications/Interventions Tried: Green shoe inserts, stretching  See HPI and/or previous note for associated ROS.  Objective: BP (!) 111/55   Ht 5\' 4"  (1.626 m)   Wt 164 lb 6.4 oz (74.6 kg)   BMI 28.22 kg/m  Gen: NAD, well groomed, a/o x3, normal affect.  CV: Well-perfused. Warm.  Resp: Non-labored.  Neuro: Sensation intact throughout. No gross coordination deficits.  Gait: Nonpathologic posture, unremarkable stride without signs of limp or balance issues. Ankle/Foot, left: TTP noted at the plantar aspect of the calcaneus. No visible erythema, swelling, ecchymosis, or bony deformity. Notable pes cavus deformity. Transverse arch grossly intact; No evidence of tibiotalar deviation; Range of motion is full in all directions. Strength is 5/5 in all directions. No peroneal tendon tenderness or subluxation; No tenderness on posterior aspects of lateral and medial malleolus; Talar dome nontender; Unremarkable calcaneal squeeze; No pain at base of 5th MT; No tenderness at the distal metatarsals; Able to walk 4 steps.    Custom Orthotics: - Patient was fitted for a standard, cushioned, semi-rigid orthotic. - Orthotic was heated and afterward the patient stood on the orthotic blank positioned on the orthotic stand. - The patient was positioned in subtalar neutral position and  10 degrees of ankle dorsiflexion in a weight bearing stance. - After completion of molding, a stable base was applied to the orthotic blank. - The blank was ground to a stable position for weight bearing. - Size: 8 - Base: Blue EVA - Additional Posting and Padding: none - The patient ambulated in these, and they were very comfortable.   Assessment and plan:  Plantar fasciitis of left foot Patient is here with signs and symptoms consistent with left-sided plantar fasciitis.  Some improvement with green shoe inserts.  The decision was made to move forward with custom orthotics today. -Patient endorsed good comfort with custom orthotics today. -Continue stretching exercises -Follow-up as needed   I spent 30 minutes with this patient. Over 50% of visit was spent in counseling and coordination of care for problems with plantar fasciitis.   Elberta Leatherwood, MD,MS [facility] Fellow 09/26/2017 5:42 PM  I observed and examined the patient with the [facility] Fellow and agree with assessment and plan.  Note reviewed and modified by me. Stefanie Libel, MD

## 2017-09-26 NOTE — Assessment & Plan Note (Signed)
Patient is here with signs and symptoms consistent with left-sided plantar fasciitis.  Some improvement with green shoe inserts.  The decision was made to move forward with custom orthotics today. -Patient endorsed good comfort with custom orthotics today. -Continue stretching exercises -Follow-up as needed

## 2017-11-11 ENCOUNTER — Other Ambulatory Visit: Payer: Self-pay

## 2017-11-11 ENCOUNTER — Encounter (HOSPITAL_COMMUNITY): Payer: Self-pay | Admitting: Emergency Medicine

## 2017-11-11 ENCOUNTER — Ambulatory Visit (HOSPITAL_COMMUNITY)
Admission: EM | Admit: 2017-11-11 | Discharge: 2017-11-11 | Disposition: A | Discharge status: Home / Self Care | Payer: 59 | Attending: Family Medicine | Admitting: Family Medicine

## 2017-11-11 DIAGNOSIS — S63631A Sprain of interphalangeal joint of left index finger, initial encounter: Secondary | ICD-10-CM | POA: Diagnosis not present

## 2017-11-11 DIAGNOSIS — W108XXA Fall (on) (from) other stairs and steps, initial encounter: Secondary | ICD-10-CM | POA: Diagnosis not present

## 2017-11-11 NOTE — ED Provider Notes (Signed)
[facility]    CSN: [id number] Arrival date & time: 11/11/17  1004     History   Chief Complaint Chief Complaint  Patient presents with  . Hand Problem    HPI Sheila Obrien is a 47 y.o. female.   HPI  Sheila Obrien is a 47 y.o. right hand-dominant female with a history of left plantar fasciitis who presents to urgent care10 days after fall with left 1st finger pain. She reports a fall that occurred at home, tripping on a stair and with unknown mechanicams, presumed fall "jamming" the left index finger with immediate moderate pain at the distal finger. Initially it was worse with movement, but has since improved. Initially swollen, that has also improved. 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 No medications taken for this. Denies other finger involvement.   Past Medical History:  Diagnosis Date  . Anxiety   . Depression   . Eczema   . Low back pain    intermittent problems with SI joint  . Ureteral reflux    s/p surgery    Patient Active Problem List   Diagnosis Date Noted  . Plantar fasciitis of left foot 09/05/2017  . Cavus deformity of foot 09/05/2017  . Vitamin D deficiency 11/06/2016  . Knee pain 02/19/2012  . Fibrocystic breast 02/13/2012  . Eczema 02/13/2012    Past Surgical History:  Procedure Laterality Date  . bilateral ureteral reimplantation  05/1990   Dr.Humphries  . CESAREAN SECTION  2004    OB History    Gravida  3   Para  2   Term      Preterm      AB  1   Living  2     SAB  1   TAB      Ectopic      Multiple      Live Births               Home Medications    Prior to Admission medications   Medication Sig Start Date End Date Taking? Authorizing Provider  buPROPion (WELLBUTRIN XL) 300 MG 24 hr tablet TAKE 1 TABLET BY MOUTH DAILY 05/30/17   Rita Ohara, MD  escitalopram (LEXAPRO) 20 MG tablet TAKE 1 TABLET BY MOUTH DAILY 05/30/17   Rita Ohara, MD  VELIVET  0.1/0.125/0.15 -0.025 MG tablet Take 1 tablet by mouth daily.  06/01/15   [provider]  Vitamin D, Ergocalciferol, (DRISDOL) 50000 units CAPS capsule Take 1 capsule (50,000 Units total) by mouth every 7 (seven) days. Patient not taking: Reported on 09/05/2017 11/06/16   Rita Ohara, MD    Family History Family History  Problem Relation Age of Onset  . Alcohol abuse Father   . Hypertension Father   . Depression Father   . Glaucoma Father   . Migraines Sister   . Migraines Son   . Cancer Other        colon cancer  . Diabetes Neg Hx   . Heart disease Neg Hx     Social History Social History   Tobacco Use  . Smoking status: Former Smoker    Last attempt to quit: 06/18/1996    Years since quitting: 21.4  . Smokeless tobacco: Never Used  Substance Use Topics  . Alcohol use: Yes    Comment: 2-3 drinks per month.  . Drug use: No     Allergies   Penicillins   Review of  Systems Review of Systems   Physical Exam Triage Vital Signs ED Triage Vitals  Enc Vitals Group     BP --      Pulse Rate 11/11/17 1014 70     Resp 11/11/17 1014 18     Temp 11/11/17 1014 98.4 F (36.9 C)     Temp Source 11/11/17 1014 Oral     SpO2 11/11/17 1014 98 %     Weight --      Height --      Head Circumference --      Peak Flow --      Pain Score 11/11/17 1012 3     Pain Loc --      Pain Edu? --      Excl. in [place]? --    No data found.  Updated Vital Signs BP 101/65 (BP Location: Left Arm)   Pulse 70   Temp 98.4 F (36.9 C) (Oral)   Resp 18   SpO2 98%   Visual Acuity Right Eye Distance:   Left Eye Distance:   Bilateral Distance:    Right Eye Near:   Left Eye Near:    Bilateral Near:     Physical Exam Left hand: no visible or palpable deformities of the hand with attention to index finger. No rotational deformity compared to right. Full painless AROM with DIP, PIP, MPC flexion, extension. Some tenderness to ulnar deviation to radial deviation and with tenderness to  palpation of ulnar aspect of DIP. This tenderness is mild and there is no swelling/erythema/fluctuance or palpable deformity. Able to resist lateral pressures.  UC Treatments / Results  Labs (all labs ordered are listed, but only abnormal results are displayed) Labs Reviewed - No data to display  EKG None  Radiology No results found.  Procedures Procedures (including critical care time)  Medications Ordered in UC Medications - No data to display  Initial Impression / Assessment and Plan / UC Course  I have reviewed the triage vital signs and the nursing notes.  Pertinent labs & imaging results that were available during my care of the patient were reviewed by me and considered in my medical decision making (see chart for details).   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 Exam is benign enough to rule out avulsion fracture that is significant enough to warrant repair. Will splint and advise return precautions (seen at [facility] where she could get U/S for further evaluation if not improving over this next week).  - Tylenol/ibuprofen prn though pt has taken nothing for this and states she doesn't plan to.  Final Clinical Impressions(s) / UC Diagnoses   Final diagnoses:  Sprain of interphalangeal joint of left index finger, initial encounter      [name], MD 11/11/17 1050

## 2017-11-11 NOTE — ED Triage Notes (Signed)
Left index finger problem.  [place] [address] Friday a week ago.  Left index finger is hurting worse, increased swelling, patient is able to bend finger.  Not work related

## 2017-11-29 ENCOUNTER — Other Ambulatory Visit: Payer: Self-pay | Admitting: Family Medicine

## 2017-11-29 DIAGNOSIS — F411 Generalized anxiety disorder: Secondary | ICD-10-CM

## 2017-11-29 DIAGNOSIS — F325 Major depressive disorder, single episode, in full remission: Secondary | ICD-10-CM

## 2017-11-29 MED FILL — ESCITALOPRAM 20 MG TABLET: 20 | 90 days supply | Qty: 90 | Fill #0

## 2017-11-29 NOTE — Telephone Encounter (Signed)
Pt states she is out of her Lexapro today and needs refill before 5 to [facility] if possible, has appt 12/18/17

## 2017-11-29 NOTE — Telephone Encounter (Signed)
Pt informed

## 2017-11-29 NOTE — Telephone Encounter (Signed)
Thanks Dr. Tomi Bamberger. I was about to do this.

## 2017-11-29 NOTE — Telephone Encounter (Signed)
Is this ok to refill?  

## 2017-11-29 NOTE — Telephone Encounter (Signed)
Please let pt know that refill was sent

## 2017-12-18 ENCOUNTER — Encounter: Payer: 59 | Admitting: Family Medicine

## 2017-12-18 DIAGNOSIS — M79642 Pain in left hand: Secondary | ICD-10-CM | POA: Diagnosis not present

## 2017-12-23 ENCOUNTER — Other Ambulatory Visit: Payer: Self-pay | Admitting: Family Medicine

## 2017-12-23 DIAGNOSIS — F411 Generalized anxiety disorder: Secondary | ICD-10-CM

## 2017-12-23 DIAGNOSIS — F325 Major depressive disorder, single episode, in full remission: Secondary | ICD-10-CM

## 2017-12-23 MED FILL — BUPROPION HCL XL 300 MG TAB: 300 | 90 days supply | Qty: 90 | Fill #0

## 2017-12-23 NOTE — Telephone Encounter (Signed)
Give just 90, no refill, and be sure she is on a cancellation list (and keep her in mind if something opens up). If she is having any problems, depression not well controlled, can come in sooner for med check (I think I just read a note from Dr. Amedeo Plenty, just saw him for ongoing pain from an injury in her finger/hand)

## 2017-12-23 NOTE — Telephone Encounter (Signed)
Canceled CPE on 12/18/17 and rescheduled for 06/19/2018. Is this okay to refill or does she need a med check?

## 2018-01-01 DIAGNOSIS — H5213 Myopia, bilateral: Secondary | ICD-10-CM | POA: Diagnosis not present

## 2018-01-01 DIAGNOSIS — H52223 Regular astigmatism, bilateral: Secondary | ICD-10-CM | POA: Diagnosis not present

## 2018-01-14 NOTE — Progress Notes (Signed)
Chief Complaint  Patient presents with  . Annual Exam    fasting annual exam, no pap sees GYN. Had recent eye exam. UA showed 1+ blood, no symptoms. Has a place on her right wrist taht she would like you to look at.     Sheila Obrien is a 47 y.o. female who presents for a complete physical.  She has the following concerns:  Rash on wrists for a couple of months--the spot on the left side resolved, but is now on the right. It is very itchy. ?if related to fitbit (not where the clasp touches, but under the device itself). It is worn loose (not heart rate monitor).  Hasn't tried anything on it yet.  Depression and anxiety: on Lexapro and Wellbutrin for many years, continues to do well on this, without any side effects.  Has tried coming off multiple times in the past, with recurrences (while under the care of Dr. Toy Care).  Prefers to stay on this medication. She no longer sees Dr. Toy Care, doesn't take her insurance.  Vitamin D deficiency:  Level of 22 on last check in 10/2016. She was treated with 12 weeks of prescription therapy, but admits she never started a daily supplement. She cut out yogurt (bothered her stomach), doesn't drink milk or eat a lot of cheese. Gets some almondmilk.  H/o abdominal bloating. She saw Dr. Mallie Mussel resolved after stopping artificial sweeteners. Did really well for a year, but started creeping back up some, eliminated yogurt. She doesn't take any probiotics.  She has been using [employer] app--she completed the 4 months.  Daily weights and charting her food (on another app).  She has lost weight, and is continuing to lose.  She is happy and feels good.  Her OCP's ran out 2-3 months ago, was curious to see what her body was doing.  Periods are regular for the most part, no significant hot flashes (occasional).  Using condoms.  She is due to see her GYN.  H/o R knee pain. She had physical therapy, and is much better.  She is doing group classes at the hospital.  She is  now able to run/walk without pain.  Immunization History  Administered Date(s) Administered  . Hepatitis B 01/07/1996, 02/12/1996, 11/19/1996  . Influenza Split 03/18/2013, 03/12/2014, 03/13/2016  . Td 06/18/1998  . Tdap 04/11/2009   She gets flu shots annually.  Last Pap smear:  Through GYN (sees Dr. Willis Modena, last seen in Spring 2018, no pap done), due now. Last mammogram: 03/2017 Last colonoscopy: never  Last DEXA: never  Dentist: twice yearly  Ophtho: yearly, wears contacts Exercise: group classes 2x/week (spin, body pump, pump and sculpt, step, Barr class).  Does exercises with bands at home. Lipids last checked last year (on Whole 30 diet, more meat and eggs than in the past)--LDL was 111 on prior check Lab Results  Component Value Date   CHOL 225 (H) 12/12/2016   HDL 69 12/12/2016   LDLCALC 134 (H) 12/12/2016   TRIG 108 12/12/2016   CHOLHDL 3.3 12/12/2016   Past Medical History:  Diagnosis Date  . Anxiety   . Depression   . Eczema   . Low back pain    intermittent problems with SI joint  . Ureteral reflux    s/p surgery    Past Surgical History:  Procedure Laterality Date  . bilateral ureteral reimplantation  05/1990   Dr.Humphries  . CESAREAN SECTION  2004    Social History   Socioeconomic History  .  Marital status: Married    Spouse name: Not on file  . Number of children: 2  . Years of education: Not on file  . Highest education level: Not on file  Occupational History  . Occupation: Forensic scientist: [employer]  . Financial resource strain: Not on file  . Food insecurity:    Worry: Not on file    Inability: Not on file  . Transportation needs:    Medical: Not on file    Non-medical: Not on file  Tobacco Use  . Smoking status: Former Smoker    Last attempt to quit: 06/18/1996    Years since quitting: 21.5  . Smokeless tobacco: Never Used  Substance and Sexual Activity  . Alcohol use: Yes    Comment: 2-3 drinks  per month.  . Drug use: No  . Sexual activity: Yes    Partners: Male    Birth control/protection: None  Lifestyle  . Physical activity:    Days per week: Not on file    Minutes per session: Not on file  . Stress: Not on file  Relationships  . Social connections:    Talks on phone: Not on file    Gets together: Not on file    Attends religious service: Not on file    Active member of club or organization: Not on file    Attends meetings of clubs or organizations: Not on file    Relationship status: Not on file  . Intimate partner violence:    Fear of current or ex partner: Not on file    Emotionally abused: Not on file    Physically abused: Not on file    Forced sexual activity: Not on file  Other Topics Concern  . Not on file  Social History Narrative   Lives with husband, 2 sons. 1 dog Hadley Pen)    Family History  Problem Relation Age of Onset  . Alcohol abuse Father   . Hypertension Father   . Depression Father   . Glaucoma Father   . Cancer Mother 95       vaginal  . Migraines Sister   . Migraines Son   . Cancer Other        colon cancer  . Diabetes Neg Hx   . Heart disease Neg Hx     Outpatient Encounter Medications as of 01/16/2018  Medication Sig Note  . buPROPion (WELLBUTRIN XL) 300 MG 24 hr tablet TAKE 1 TABLET BY MOUTH DAILY   . escitalopram (LEXAPRO) 20 MG tablet TAKE 1 TABLET BY MOUTH DAILY   . VELIVET 0.1/0.125/0.15 -0.025 MG tablet Take 1 tablet by mouth daily.  01/16/2018: Ran out a few months ago  . [DISCONTINUED] Vitamin D, Ergocalciferol, (DRISDOL) 50000 units CAPS capsule Take 1 capsule (50,000 Units total) by mouth every 7 (seven) days. (Patient not taking: Reported on 09/05/2017)    No facility-administered encounter medications on file as of 01/16/2018.     Allergies  Allergen Reactions  . Penicillins Hives    ROS: The patient denies anorexia, fever, vision changes, decreased hearing, ear pain, sore throat, chest pain, palpitations, dizziness,  syncope, dyspnea on exertion, cough, swelling,diarrhea, constipation, abdominal pain, melena, hematochezia, indigestion/heartburn, hematuria, incontinence, dysuria, irregular menstrual cycles, vaginal discharge, or odor, genital lesions, numbness, tingling, weakness, tremor, suspicious skin lesions, depression, anxiety, abnormal bleeding/bruising, or enlarged lymph nodes.  Chronic hearing loss L ear since childhood. No tinnitus.  Mild bloating eczma on hands, intermittent, flaring  on left arm in a small spot now. Rash on right wrist per HPI. Some chronic, mild back pain (SI problems) since age of 65 Intentional weight loss, per HPI   PHYSICAL EXAM:  BP 100/70   Pulse 72   Ht 5\' 4"  (1.626 m)   Wt 153 lb 3.2 oz (69.5 kg)   LMP 12/30/2017 (Approximate)   BMI 26.30 kg/m   Wt Readings from Last 3 Encounters:  01/16/18 153 lb 3.2 oz (69.5 kg)  09/26/17 164 lb 6.4 oz (74.6 kg)  09/05/17 169 lb (76.7 kg)     General Appearance:  Alert, cooperative, no distress, appears stated age   Head:  Normocephalic, without obvious abnormality, atraumatic   Eyes:  PERRL, conjunctiva/corneas clear, EOM's intact, fundi benign   Ears:  Normal TM's and external ear canals   Nose:  Nares normal, mucosa normal, no drainage or sinus tenderness   Throat:  Lips, mucosa, and tongue normal; teeth and gums normal   Neck:  Supple, no lymphadenopathy; thyroid: no enlargement/tenderness/nodules; no carotid bruit or JVD   Back:  Spine nontender, no curvature, ROM normal, no CVA tenderness   Lungs:  Clear to auscultation bilaterally without wheezes, rales or ronchi; respirations unlabored   Chest Wall:  No tenderness or deformity   Heart:  Regular rate and rhythm, S1 and S2 normal, no murmur, rub or gallop   Breast Exam:  Deferred to GYN  Abdomen:  Soft, non-tender, nondistended, normoactive bowel sounds,  no masses, no hepatosplenomegaly   Genitalia:  Deferred to GYN      Extremities:  No clubbing, cyanosis  or edema.   Pulses:  2+ and symmetric all extremities   Skin:  Skin color, texture, turgor normal, no lesions. See below for rash description  Lymph nodes:  Cervical, supraclavicular, and axillary nodes normal   Neurologic:  CNII-XII intact, normal strength, sensation and gait; reflexes 2+ and symmetric throughout      Psych: Normal mood, affect, hygiene and grooming.   Right forearm, near wrist, on dorsal aspect are a couple of patches of thickened, pink, dry skin, one of which is excoriated.  No crusting or significant erythema or warmth.   ASSESSMENT/PLAN:  Annual physical exam - Plan: POCT Urinalysis DIP (Proadvantage Device), Lipid panel, Comprehensive metabolic panel, CBC with Differential/Platelet, VITAMIN D 25 Hydroxy (Vit-D Deficiency, Fractures), TSH  Vitamin D deficiency - Plan: VITAMIN D 25 Hydroxy (Vit-D Deficiency, Fractures)  Depression, major, in remission (HCC)  Generalized anxiety disorder  Weight loss - intentional, done in a healthy way - Plan: TSH  Eczema, unspecified type - moisturize well, TAC prn - Plan: triamcinolone cream (KENALOG) 0.1 %  Contact dermatitis, unspecified contact dermatitis type, unspecified trigger - TAC BID to affected area for upto 2 weeks, if needed  Briefly discussed contraception--ok to continue condoms (with plan B, if needed),vs restarting OCP's (past due for visit with GYN); she was asking about IUD--given age and lack of estrogen to treat any perimenopausal symptoms, that wouldn't be my first choice, since she hasn't had problems or contraindications to OCPs.  Will discuss with Dr. Willis Modena.  Discussed monthly self breast exams and yearly mammograms; at least 30 minutes of aerobic activity at least 5 days/week, weight-bearing exercise at least 2x/week; proper sunscreen use reviewed; healthy diet, including goals of calcium and vitamin D intake and alcohol recommendations (less than or equal to 1 drink/day)  reviewed; regular seatbelt use; changing batteries in smoke detectors.  Immunization recommendations discussed--continue yearly flu shots.  Shingrix  at 5. Tetanus booster due in October--to get from employee health and let us know the date/type.  Colonoscopy recommendations reviewed--age 64

## 2018-01-16 ENCOUNTER — Ambulatory Visit (INDEPENDENT_AMBULATORY_CARE_PROVIDER_SITE_OTHER): Payer: 59 | Admitting: Family Medicine

## 2018-01-16 ENCOUNTER — Encounter: Payer: Self-pay | Admitting: Family Medicine

## 2018-01-16 VITALS — BP 100/70 | HR 72 | Ht 64.0 in | Wt 153.2 lb

## 2018-01-16 DIAGNOSIS — L309 Dermatitis, unspecified: Secondary | ICD-10-CM

## 2018-01-16 DIAGNOSIS — F325 Major depressive disorder, single episode, in full remission: Secondary | ICD-10-CM | POA: Diagnosis not present

## 2018-01-16 DIAGNOSIS — Z0000 Encounter for general adult medical examination without abnormal findings: Secondary | ICD-10-CM | POA: Diagnosis not present

## 2018-01-16 DIAGNOSIS — L259 Unspecified contact dermatitis, unspecified cause: Secondary | ICD-10-CM

## 2018-01-16 DIAGNOSIS — E559 Vitamin D deficiency, unspecified: Secondary | ICD-10-CM

## 2018-01-16 DIAGNOSIS — F411 Generalized anxiety disorder: Secondary | ICD-10-CM | POA: Diagnosis not present

## 2018-01-16 DIAGNOSIS — R634 Abnormal weight loss: Secondary | ICD-10-CM | POA: Diagnosis not present

## 2018-01-16 LAB — POCT URINALYSIS DIP (PROADVANTAGE DEVICE)
Bilirubin, UA: NEGATIVE
GLUCOSE UA: NEGATIVE mg/dL
Ketones, POC UA: NEGATIVE mg/dL
LEUKOCYTES UA: NEGATIVE
Nitrite, UA: NEGATIVE
PROTEIN UA: NEGATIVE mg/dL
SPECIFIC GRAVITY, URINE: 1.03
UUROB: NEGATIVE
pH, UA: 6 (ref 5.0–8.0)

## 2018-01-16 MED ORDER — TRIAMCINOLONE ACETONIDE 0.1 % EX CREA: 1.0000 "application " | TOPICAL_CREAM | Freq: Two times a day (BID) | CUTANEOUS | 0 refills | Status: DC | PRN

## 2018-01-16 MED FILL — TRIAMCINOLONE 0.1% CREAM: 0.1 | 21 days supply | Qty: 45 | Fill #0

## 2018-01-16 NOTE — Patient Instructions (Addendum)
  HEALTH MAINTENANCE RECOMMENDATIONS:  It is recommended that you get at least 30 minutes of aerobic exercise at least 5 days/week (for weight loss, you may need as much as 60-90 minutes). This can be any activity that gets your heart rate up. This can be divided in 10-15 minute intervals if needed, but try and build up your endurance at least once a week.  Weight bearing exercise is also recommended twice weekly.  Eat a healthy diet with lots of vegetables, fruits and fiber.  "Colorful" foods have a lot of vitamins (ie green vegetables, tomatoes, red peppers, etc).  Limit sweet tea, regular sodas and alcoholic beverages, all of which has a lot of calories and sugar.  Up to 1 alcoholic drink daily may be beneficial for women (unless trying to lose weight, watch sugars).  Drink a lot of water.  Calcium recommendations are 1200-1500 mg daily (1500 mg for postmenopausal women or women without ovaries), and vitamin D 1000 IU daily.  This should be obtained from diet and/or supplements (vitamins), and calcium should not be taken all at once, but in divided doses.  Monthly self breast exams and yearly mammograms for women over the age of 20 is recommended.  Sunscreen of at least SPF 30 should be used on all sun-exposed parts of the skin when outside between the hours of 10 am and 4 pm (not just when at beach or pool, but even with exercise, golf, tennis, and yard work!)  Use a sunscreen that says "broad spectrum" so it covers both UVA and UVB rays, and make sure to reapply every 1-2 hours.  Remember to change the batteries in your smoke detectors when changing your clock times in the spring and fall.  Use your seat belt every time you are in a car, and please drive safely and not be distracted with cell phones and texting while driving.  Viactiv chews were the chews we spoke of--calcium, D and vitamin K, versus Tums as a calcium supplement. 1000 IU of vitamin D3 is the daily recommendation. I suspect your  level will be low again and need a prescription for a short while, before starting the OTC supplement.  For longterm, taking 2000 IU of the D3 small gelcap may be good--if you only remember to take it 3-4 times/week, you are still covered..  Consider taking probiotics to help with your bloating/bowels.  You will be due for a tetanus booster in October--get through Employee health and send [place] a message with the date (and type Td vs TdaP).  [employer] Dog app and Fitbit coach are some ways to get some extra exercise that might work with your schedule better than classes, in shorter amounts of time.

## 2018-01-17 LAB — CBC WITH DIFFERENTIAL/PLATELET
BASOS: 1 %
Basophils Absolute: 0.1 10*3/uL (ref 0.0–0.2)
EOS (ABSOLUTE): 0.1 10*3/uL (ref 0.0–0.4)
EOS: 2 %
HEMATOCRIT: 39.1 % (ref 34.0–46.6)
Hemoglobin: 13.1 g/dL (ref 11.1–15.9)
Immature Grans (Abs): 0 10*3/uL (ref 0.0–0.1)
Immature Granulocytes: 0 %
LYMPHS ABS: 1.6 10*3/uL (ref 0.7–3.1)
Lymphs: 33 %
MCH: 32 pg (ref 26.6–33.0)
MCHC: 33.5 g/dL (ref 31.5–35.7)
MCV: 96 fL (ref 79–97)
MONOS ABS: 0.5 10*3/uL (ref 0.1–0.9)
Monocytes: 10 %
Neutrophils Absolute: 2.6 10*3/uL (ref 1.4–7.0)
Neutrophils: 54 %
PLATELETS: 258 10*3/uL (ref 150–450)
RBC: 4.09 x10E6/uL (ref 3.77–5.28)
RDW: 12.9 % (ref 12.3–15.4)
WBC: 4.7 10*3/uL (ref 3.4–10.8)

## 2018-01-17 LAB — COMPREHENSIVE METABOLIC PANEL
ALBUMIN: 4.2 g/dL (ref 3.5–5.5)
ALK PHOS: 60 IU/L (ref 39–117)
ALT: 10 IU/L (ref 0–32)
AST: 13 IU/L (ref 0–40)
Albumin/Globulin Ratio: 1.8 (ref 1.2–2.2)
BUN / CREAT RATIO: 9 (ref 9–23)
BUN: 8 mg/dL (ref 6–24)
Bilirubin Total: 0.4 mg/dL (ref 0.0–1.2)
CALCIUM: 9.5 mg/dL (ref 8.7–10.2)
CO2: 23 mmol/L (ref 20–29)
CREATININE: 0.89 mg/dL (ref 0.57–1.00)
Chloride: 103 mmol/L (ref 96–106)
GFR calc Af Amer: 89 mL/min/{1.73_m2} (ref >59)
GFR, EST NON AFRICAN AMERICAN: 77 mL/min/{1.73_m2} (ref >59)
GLOBULIN, TOTAL: 2.3 g/dL (ref 1.5–4.5)
Glucose: 86 mg/dL (ref 65–99)
Potassium: 4.2 mmol/L (ref 3.5–5.2)
SODIUM: 139 mmol/L (ref 134–144)
Total Protein: 6.5 g/dL (ref 6.0–8.5)

## 2018-01-17 LAB — TSH: TSH: 1.87 u[IU]/mL (ref 0.450–4.500)

## 2018-01-17 LAB — LIPID PANEL
CHOLESTEROL TOTAL: 213 mg/dL — AB (ref 100–199)
Chol/HDL Ratio: 3 ratio (ref 0.0–4.4)
HDL: 70 mg/dL (ref >39)
LDL CALC: 130 mg/dL — AB (ref 0–99)
TRIGLYCERIDES: 63 mg/dL (ref 0–149)
VLDL CHOLESTEROL CAL: 13 mg/dL (ref 5–40)

## 2018-01-17 LAB — VITAMIN D 25 HYDROXY (VIT D DEFICIENCY, FRACTURES): Vit D, 25-Hydroxy: 23.7 ng/mL — ABNORMAL LOW (ref 30.0–100.0)

## 2018-01-17 MED ORDER — VITAMIN D (ERGOCALCIFEROL) 1.25 MG (50000 UNIT) PO CAPS: 50000.0000 [IU] | ORAL_CAPSULE | ORAL | 0 refills | Status: DC

## 2018-01-17 MED FILL — VIT D2 1.25 MG (50,000 UNIT: 1.25 MG | 84 days supply | Qty: 12 | Fill #0

## 2018-01-17 NOTE — Addendum Note (Signed)
Addended byRita Ohara on: 01/17/2018 12:46 PM   Modules accepted: Orders

## 2018-01-20 NOTE — Progress Notes (Signed)
Health maintenance form given to [facility].

## 2018-01-27 ENCOUNTER — Encounter: Payer: Self-pay | Admitting: *Deleted

## 2018-03-04 ENCOUNTER — Other Ambulatory Visit: Payer: Self-pay | Admitting: Family Medicine

## 2018-03-04 DIAGNOSIS — F411 Generalized anxiety disorder: Secondary | ICD-10-CM

## 2018-03-04 DIAGNOSIS — F325 Major depressive disorder, single episode, in full remission: Secondary | ICD-10-CM

## 2018-03-04 MED FILL — ESCITALOPRAM 20 MG TABLET: 20 | 90 days supply | Qty: 90 | Fill #0

## 2018-03-04 NOTE — Telephone Encounter (Signed)
[facility] is requesting to fill pt lexapro. Please advise if this is ok. Thanks [employer]

## 2018-03-26 ENCOUNTER — Other Ambulatory Visit: Payer: Self-pay | Admitting: Family Medicine

## 2018-03-26 DIAGNOSIS — F325 Major depressive disorder, single episode, in full remission: Secondary | ICD-10-CM

## 2018-03-26 DIAGNOSIS — F411 Generalized anxiety disorder: Secondary | ICD-10-CM

## 2018-03-26 MED FILL — BuPROPion HCL ER (XL) 300 M: 300 | 90 days supply | Qty: 90 | Fill #0

## 2018-06-02 MED FILL — ESCITALOPRAM 20 MG TABLET: 20 | 90 days supply | Qty: 90 | Fill #1

## 2018-06-19 ENCOUNTER — Encounter: Payer: 59 | Admitting: Family Medicine

## 2018-06-26 ENCOUNTER — Other Ambulatory Visit: Payer: Self-pay | Admitting: Family Medicine

## 2018-06-26 DIAGNOSIS — F411 Generalized anxiety disorder: Secondary | ICD-10-CM

## 2018-06-26 DIAGNOSIS — F325 Major depressive disorder, single episode, in full remission: Secondary | ICD-10-CM

## 2018-06-26 MED FILL — buPROPion HCL ER (XL) 300 M: 300 | 90 days supply | Qty: 90 | Fill #0

## 2018-07-08 DIAGNOSIS — Z1231 Encounter for screening mammogram for malignant neoplasm of breast: Secondary | ICD-10-CM | POA: Diagnosis not present

## 2018-07-08 LAB — HM MAMMOGRAPHY

## 2018-07-09 ENCOUNTER — Encounter: Payer: Self-pay | Admitting: Family Medicine

## 2018-09-02 MED FILL — ESCITALOPRAM 20 MG TABLET: 20 | 90 days supply | Qty: 90 | Fill #2

## 2018-09-26 MED FILL — buPROPion HCL ER (XL) 300 M: 300 | 90 days supply | Qty: 90 | Fill #0

## 2018-11-17 DIAGNOSIS — M25521 Pain in right elbow: Secondary | ICD-10-CM | POA: Diagnosis not present

## 2018-11-17 DIAGNOSIS — M7711 Lateral epicondylitis, right elbow: Secondary | ICD-10-CM | POA: Diagnosis not present

## 2018-11-27 MED FILL — ESCITALOPRAM 20 MG TABLET: 20 | 90 days supply | Qty: 90 | Fill #3

## 2018-12-24 ENCOUNTER — Other Ambulatory Visit: Payer: Self-pay | Admitting: Family Medicine

## 2018-12-24 DIAGNOSIS — F411 Generalized anxiety disorder: Secondary | ICD-10-CM

## 2018-12-24 DIAGNOSIS — F325 Major depressive disorder, single episode, in full remission: Secondary | ICD-10-CM

## 2018-12-25 ENCOUNTER — Other Ambulatory Visit: Payer: Self-pay

## 2018-12-25 MED ORDER — AZITHROMYCIN 250 MG PO TABS: ORAL_TABLET | ORAL | 0 refills | Status: DC

## 2018-12-25 MED FILL — buPROPion HCL ER (XL) 300 M: 300 | 90 days supply | Qty: 90 | Fill #0

## 2018-12-25 MED FILL — AZITHROMYCIN 250 MG TABLET: 250 | 5 days supply | Qty: 6 | Fill #0

## 2018-12-25 NOTE — Telephone Encounter (Signed)
Pt is allergic to PCN, sent in [place] to pharmacy. Needs eval if doesn't resolve.  Didn't say which pharmacy, [facility] outpt pharmacy is listed--is that open again? Is that where she wants it sent?  I pended order, okay to sign once pharmacy confirmed.

## 2018-12-25 NOTE — Telephone Encounter (Signed)
Pt called and stated her son was diagnosed with strep on yesterday and she is now experiencing the same symptoms. Symptoms include sore throat, ear pain and headache. Patient would like something to be sent to the pharmacy. Please advise.

## 2019-01-18 NOTE — Progress Notes (Signed)
Chief Complaint  Patient presents with  . Annual Exam    fasting annual exam, no pap. No concerns.     Sheila Obrien is a 48 y.o. female who presents for a complete physical.  She has the following concerns:  Depression and anxiety: on Lexapro and Wellbutrin for many years, continues to do well on this, without any side effects.  Has tried coming off multiple times in the past, with recurrences (while under the care of Dr. Toy Care).  Prefers to stay on this medication. She no longer sees Dr. Toy Care, doesn't take her insurance.  Vitamin D  Last level was 23.7 at physical last year (had been 22 in 10/2016).  She was treated with 12 weeks of prescription therapy, and advised to start a D supplement.  She had stomach cramps when she took Viactiv.  She admits she hasn't taken any supplement since. She doesn't drink milk or eat a lot of cheese. Gets some almondmilk.   H/o abdominal bloating. She saw Dr. Mallie Mussel resolved after stopping artificial sweeteners. Some foods on her current diet make her feel a little bloated.  She has some constipation, takes a natural Calm (Magnesium), and had a lot of cramping afterwards.  Had a small bowel movement.  Stools are small, hard, some straining; doesn't feel like she empties well.  Last year she had been using Noom app, and had lost weight.  She reports she gained it all back, despite not changing her habits. She is now doing [place] (sent shakes, bars, pasta, soups, chili--small meals to eat, and can eat 1 real meal/day until weight loss goal achieved, then transition to all regular food). She reports she is still losing weight, so hasn't transitioned over yet. She is feeling great and very pleased the results and the program.  She has had some fluctuations in her libido--was good for a while, in October, but since the spring is down again. She does not recall any improvement in libido when she had stop her SSRI in the past.  Immunization History   Administered Date(s) Administered  . Hepatitis B 01/07/1996, 02/12/1996, 11/19/1996  . Influenza Split 03/18/2013, 03/12/2014, 03/13/2016  . Td 06/18/1998  . Tdap 04/11/2009   She gets flu shots annually.  Last Pap smear:Through GYN(in 2017; sees Dr. Willis Modena, last seen about a year and half ago, knows she is due) Last mammogram: 06/2018 Last colonoscopy: never  Last DEXA: never  Dentist: twice yearly  Ophtho: yearly, wears contacts Exercise:Stopped doing exercise classes even before COVID hit; current exercise is only occasional walking. Prior routine had been group classes 2x/week (spin, body pump, pump and sculpt, step, barre class).  Lipids: Lab Results  Component Value Date   CHOL 213 (H) 01/16/2018   HDL 70 01/16/2018   LDLCALC 130 (H) 01/16/2018   TRIG 63 01/16/2018   CHOLHDL 3.0 01/16/2018   She reports being told her Total cholesterol was 174 when she donated blood through [facility] recently.  Past Medical History:  Diagnosis Date  . Anxiety   . Depression   . Eczema   . Low back pain    intermittent problems with SI joint  . Ureteral reflux    s/p surgery    Past Surgical History:  Procedure Laterality Date  . bilateral ureteral reimplantation  05/1990   Dr.Humphries  . CESAREAN SECTION  2004    Social History   Socioeconomic History  . Marital status: Married    Spouse name: Not on file  . Number  of children: 2  . Years of education: Not on file  . Highest education level: Not on file  Occupational History  . Occupation: Forensic scientist: [employer]  . Financial resource strain: Not on file  . Food insecurity    Worry: Not on file    Inability: Not on file  . Transportation needs    Medical: Not on file    Non-medical: Not on file  Tobacco Use  . Smoking status: Former Smoker    Quit date: 06/18/1996    Years since quitting: 22.6  . Smokeless tobacco: Never Used  Substance and Sexual Activity  . Alcohol  use: Yes    Comment: 2-3 drinks per month. None since 4/30 on weight loss program  . Drug use: No  . Sexual activity: Yes    Partners: Male    Birth control/protection: Condom  Lifestyle  . Physical activity    Days per week: Not on file    Minutes per session: Not on file  . Stress: Not on file  Relationships  . Social Herbalist on phone: Not on file    Gets together: Not on file    Attends religious service: Not on file    Active member of club or organization: Not on file    Attends meetings of clubs or organizations: Not on file    Relationship status: Not on file  . Intimate partner violence    Fear of current or ex partner: Not on file    Emotionally abused: Not on file    Physically abused: Not on file    Forced sexual activity: Not on file  Other Topics Concern  . Not on file  Social History Narrative   Lives with husband, 2 sons. 1 dog Hadley Pen)    Family History  Problem Relation Age of Onset  . Alcohol abuse Father   . Hypertension Father   . Depression Father   . Glaucoma Father   . Cancer Mother 52       vaginal  . Migraines Sister   . Migraines Son        better after puberty  . Cancer Other        colon cancer (older when diagnosed)  . Diabetes Neg Hx   . Heart disease Neg Hx     Outpatient Encounter Medications as of 01/21/2019  Medication Sig Note  . buPROPion (WELLBUTRIN XL) 300 MG 24 hr tablet TAKE 1 TABLET BY MOUTH DAILY   . escitalopram (LEXAPRO) 20 MG tablet TAKE 1 TABLET BY MOUTH DAILY   . triamcinolone cream (KENALOG) 0.1 % Apply 1 application topically 2 (two) times daily as needed. (Patient not taking: Reported on 01/21/2019)   . [DISCONTINUED] azithromycin (ZITHROMAX) 250 MG tablet Take 2 tablets by mouth on first day, then 1 tablet by mouth on days 2 through 5   . [DISCONTINUED] VELIVET 0.1/0.125/0.15 -0.025 MG tablet Take 1 tablet by mouth daily.  01/16/2018: Ran out a few months ago  . [DISCONTINUED] Vitamin D, Ergocalciferol,  (DRISDOL) 50000 units CAPS capsule Take 1 capsule (50,000 Units total) by mouth every 7 (seven) days.    No facility-administered encounter medications on file as of 01/21/2019.     Allergies  Allergen Reactions  . Penicillins Hives    ROS: The patient denies anorexia, fever,vision changes, decreased hearing, ear pain, sore throat, chest pain, palpitations, dizziness, syncope, dyspnea on exertion, cough, swelling,diarrhea, constipation, abdominal pain,  melena, hematochezia, indigestion/heartburn, hematuria, incontinence, dysuria, irregular menstrual cycles, vaginal discharge, or odor, genital lesions, numbness, tingling, weakness, tremor, suspicious skin lesions, depression, anxiety, abnormal bleeding/bruising, or enlarged lymph nodes.  Chronic hearing loss L ear since childhood. No tinnitus.Thinks it might have gotten a little worse (since we are wearing masks it is more noticeable). Constipation and some bloating, per HPI (less bloating than in the past). eczma on hands, wrist, intermittent. Some chronic, mild back pain (SI problems) since age of 89.  Hasn't flared in a long time. No hot flashes (had some, but got better); periods are only slightly irregular.   PHYSICAL EXAM:  BP 108/70   Pulse 80   Temp 98.4 F (36.9 C) (Temporal)   Ht 5\' 4"  (1.626 m)   Wt 144 lb (65.3 kg)   LMP 01/07/2019 (Approximate)   BMI 24.72 kg/m   Wt Readings from Last 3 Encounters:  01/21/19 144 lb (65.3 kg)  01/16/18 153 lb 3.2 oz (69.5 kg)  09/26/17 164 lb 6.4 oz (74.6 kg)   General Appearance:  Alert, cooperative, no distress, appears stated age   Head:  Normocephalic, without obvious abnormality, atraumatic   Eyes:  PERRL, conjunctiva/corneas clear, EOM's intact, fundi benign   Ears:  Normal TM's and external ear canals   Nose:  Not examined (wearing mask due to COVID-19)  Throat:  Not examined (wearing mask due to COVID-19)  Neck:  Supple, no lymphadenopathy; thyroid: no enlargement/  tenderness/nodules; no carotid bruit or JVD   Back:  Spine nontender, no curvature, ROM normal, no CVA tenderness   Lungs:  Clear to auscultation bilaterally without wheezes, rales or ronchi; respirations unlabored   Chest Wall:  No tenderness or deformity   Heart:  Regular rate and rhythm, S1 and S2 normal, no murmur, rub or gallop   Breast Exam:  Deferred to GYN  Abdomen:  Soft, non-tender, nondistended, normoactive bowel sounds,  no masses, no hepatosplenomegaly   Genitalia:  Deferred to GYN      Extremities:  No clubbing, cyanosis or edema.   Pulses:  2+ and symmetric all extremities   Skin:  Skin color, texture, turgor normal, no lesions. Some hyperpigmentation/actinic changes to skin on her legs.  Lymph nodes:  Cervical, supraclavicular, and axillary nodes normal   Neurologic:  CNII-XII intact, normal strength, sensation and gait; reflexes 2+ and symmetric throughout   Psych: Normal mood, affect, hygiene and grooming.    ASSESSMENT/PLAN:  Annual physical exam  Vitamin D deficiency - expect will be low and need rx replacement again. Once completed, advised to take 1000 IU od D3 longterm, and to buy now - Plan: VITAMIN D 25 Hydroxy (Vit-D Deficiency, Fractures)  Depression, major, in remission ([place]) - Plan: buPROPion (WELLBUTRIN XL) 300 MG 24 hr tablet, escitalopram (LEXAPRO) 20 MG tablet  Generalized anxiety disorder - Plan: buPROPion (WELLBUTRIN XL) 300 MG 24 hr tablet, escitalopram (LEXAPRO) 20 MG tablet  Need for tetanus booster - Plan: Tdap vaccine greater than or equal to 7yo IM  Decreased libido - fluctuations suggest hormonal component (not related to SSRI); can discuss with GYN    Discussed monthly self breast exams and yearly mammograms; at least 30 minutes of aerobic activity at least 5 days/week, weight-bearing exercise at least 2x/week; proper sunscreen use reviewed; healthy diet, including goals of calcium and vitamin D intake and  alcohol recommendations (less than or equal to 1 drink/day) reviewed; regular seatbelt use; changing batteries in smoke detectors. Immunization recommendations discussed--continue yearly flu shots. Shingrix at  50. Tdap given today. Colonoscopy recommendations reviewed--age 35

## 2019-01-18 NOTE — Patient Instructions (Addendum)
HEALTH MAINTENANCE RECOMMENDATIONS:  It is recommended that you get at least 30 minutes of aerobic exercise at least 5 days/week (for weight loss, you may need as much as 60-90 minutes). This can be any activity that gets your heart rate up. This can be divided in 10-15 minute intervals if needed, but try and build up your endurance at least once a week.  Weight bearing exercise is also recommended twice weekly.  Eat a healthy diet with lots of vegetables, fruits and fiber.  "Colorful" foods have a lot of vitamins (ie green vegetables, tomatoes, red peppers, etc).  Limit sweet tea, regular sodas and alcoholic beverages, all of which has a lot of calories and sugar.  Up to 1 alcoholic drink daily may be beneficial for women (unless trying to lose weight, watch sugars).  Drink a lot of water.  Calcium recommendations are 1200-1500 mg daily (1500 mg for postmenopausal women or women without ovaries), and vitamin D 1000 IU daily.  This should be obtained from diet and/or supplements (vitamins), and calcium should not be taken all at once, but in divided doses.  Monthly self breast exams and yearly mammograms for women over the age of 49 is recommended.  Sunscreen of at least SPF 30 should be used on all sun-exposed parts of the skin when outside between the hours of 10 am and 4 pm (not just when at beach or pool, but even with exercise, golf, tennis, and yard work!)  Use a sunscreen that says "broad spectrum" so it covers both UVA and UVB rays, and make sure to reapply every 1-2 hours.  Remember to change the batteries in your smoke detectors when changing your clock times in the spring and fall. Carbon monoxide detectors are recommended for your home.  Use your seat belt every time you are in a car, and please drive safely and not be distracted with cell phones and texting while driving.  I recommend getting the new shingles vaccine (Shingrix) after age 51. You will need to check with your insurance  to see if it is covered, you can schedule a nurse visit at our office at your convenience.  It is a series of 2 injections, spaced 2 months apart.  Please be sure to take Vitamin D3 1000 IU every day, long-term. IF your level is low enough to require prescription therapy (we will let you know these results tomorrow), then you can wait on starting this D3 until you finish the prescription, but [place].  Contact Dr. Lorie Apley office about setting up a screening colonoscopy for when you are 28.  Increase the calcium and fiber in your diet as we discussed. Consider stool softener (Colace) or miralax, if needed if you continue to deal with constipation.   High-Fiber Diet Fiber, also called dietary fiber, is a type of carbohydrate that is found in fruits, vegetables, whole grains, and beans. A high-fiber diet can have many health benefits. Your health care provider may recommend a high-fiber diet to help:  Prevent constipation. Fiber can make your bowel movements more regular.  Lower your cholesterol.  Relieve the following conditions: ? Swelling of veins in the anus (hemorrhoids). ? Swelling and irritation (inflammation) of specific areas of the digestive tract (uncomplicated diverticulosis). ? A problem of the large intestine (colon) that sometimes causes pain and diarrhea (irritable bowel syndrome, IBS).  Prevent overeating as part of a weight-loss plan.  Prevent heart disease, type 2 diabetes, and certain cancers. What is my plan? The recommended  daily fiber intake in grams (g) includes:  38 g for men age 62 or younger.  30 g for men over age 66.  87 g for women age 10 or younger.  21 g for women over age 33. You can get the recommended daily intake of dietary fiber by:  Eating a variety of fruits, vegetables, grains, and beans.  Taking a fiber supplement, if it is not possible to get enough fiber through your diet. What do I need to know about a high-fiber diet?  It is  better to get fiber through food sources rather than from fiber supplements. There is not a lot of research about how effective supplements are.  Always check the fiber content on the nutrition facts label of any prepackaged food. Look for foods that contain 5 g of fiber or more per serving.  Talk with a diet and nutrition specialist (dietitian) if you have questions about specific foods that are recommended or not recommended for your medical condition, especially if those foods are not listed below.  Gradually increase how much fiber you consume. If you increase your intake of dietary fiber too quickly, you may have bloating, cramping, or gas.  Drink plenty of water. Water helps you to digest fiber. What are tips for following this plan?  Eat a wide variety of high-fiber foods.  Make sure that half of the grains that you eat each day are whole grains.  Eat breads and cereals that are made with whole-grain flour instead of refined flour or white flour.  Eat brown rice, bulgur wheat, or millet instead of white rice.  Start the day with a breakfast that is high in fiber, such as a cereal that contains 5 g of fiber or more per serving.  Use beans in place of meat in soups, salads, and pasta dishes.  Eat high-fiber snacks, such as berries, raw vegetables, nuts, and popcorn.  Choose whole fruits and vegetables instead of processed forms like juice or sauce. What foods can I eat?  Fruits Berries. Pears. Apples. Oranges. Avocado. Prunes and raisins. Dried figs. Vegetables Sweet potatoes. Spinach. Kale. Artichokes. Cabbage. Broccoli. Cauliflower. Green peas. Carrots. Squash. Grains Whole-grain breads. Multigrain cereal. Oats and oatmeal. Brown rice. Barley. Bulgur wheat. [place]. Quinoa. Bran muffins. Popcorn. Rye wafer crackers. Meats and other proteins [place], kidney, and pinto beans. Soybeans. Split peas. Lentils. Nuts and seeds. Dairy Fiber-fortified yogurt. Beverages Fiber-fortified  soy milk. Fiber-fortified orange juice. Other foods Fiber bars. The items listed above may not be a complete list of recommended foods and beverages. Contact a dietitian for more options. What foods are not recommended? Fruits Fruit juice. Cooked, strained fruit. Vegetables Fried potatoes. Canned vegetables. Well-cooked vegetables. Grains White bread. Pasta made with refined flour. White rice. Meats and other proteins Fatty cuts of meat. Fried chicken or fried fish. Dairy Milk. Yogurt. Cream cheese. Sour cream. Fats and oils Butters. Beverages Soft drinks. Other foods Cakes and pastries. The items listed above may not be a complete list of foods and beverages to avoid. Contact a dietitian for more information. Summary  Fiber is a type of carbohydrate. It is found in fruits, vegetables, whole grains, and beans.  There are many health benefits of eating a high-fiber diet, such as preventing constipation, lowering blood cholesterol, helping with weight loss, and reducing your risk of heart disease, diabetes, and certain cancers.  Gradually increase your intake of fiber. Increasing too fast can result in cramping, bloating, and gas. Drink plenty of water while you  increase your fiber.  The best sources of fiber include whole fruits and vegetables, whole grains, nuts, seeds, and beans. This information is not intended to replace advice given to you by your health care provider. Make sure you discuss any questions you have with your health care provider. Document Released: 06/04/2005 Document Revised: 04/08/2017 Document Reviewed: 04/08/2017 Elsevier Patient Education  2020 [employer].

## 2019-01-21 ENCOUNTER — Ambulatory Visit (INDEPENDENT_AMBULATORY_CARE_PROVIDER_SITE_OTHER): Payer: 59 | Admitting: Family Medicine

## 2019-01-21 ENCOUNTER — Other Ambulatory Visit: Payer: Self-pay

## 2019-01-21 ENCOUNTER — Encounter: Payer: Self-pay | Admitting: Family Medicine

## 2019-01-21 VITALS — BP 108/70 | HR 80 | Temp 98.4°F | Ht 64.0 in | Wt 144.0 lb

## 2019-01-21 DIAGNOSIS — Z0000 Encounter for general adult medical examination without abnormal findings: Secondary | ICD-10-CM | POA: Diagnosis not present

## 2019-01-21 DIAGNOSIS — E559 Vitamin D deficiency, unspecified: Secondary | ICD-10-CM

## 2019-01-21 DIAGNOSIS — R6882 Decreased libido: Secondary | ICD-10-CM | POA: Diagnosis not present

## 2019-01-21 DIAGNOSIS — Z23 Encounter for immunization: Secondary | ICD-10-CM

## 2019-01-21 DIAGNOSIS — F411 Generalized anxiety disorder: Secondary | ICD-10-CM | POA: Diagnosis not present

## 2019-01-21 DIAGNOSIS — F325 Major depressive disorder, single episode, in full remission: Secondary | ICD-10-CM | POA: Diagnosis not present

## 2019-01-21 MED ORDER — ESCITALOPRAM OXALATE 20 MG PO TABS: 20.0000 mg | ORAL_TABLET | Freq: Every day | ORAL | 3 refills | Status: DC

## 2019-01-21 MED ORDER — BUPROPION HCL ER (XL) 300 MG PO TB24: 300.0000 mg | ORAL_TABLET | Freq: Every day | ORAL | 3 refills | Status: DC

## 2019-01-22 LAB — VITAMIN D 25 HYDROXY (VIT D DEFICIENCY, FRACTURES): Vit D, 25-Hydroxy: 39.2 ng/mL (ref 30.0–100.0)

## 2019-03-02 MED FILL — ESCITALOPRAM 20 MG TABLET: 20 | 90 days supply | Qty: 90 | Fill #0

## 2019-03-24 MED FILL — buPROPion HCL ER (XL) 300 M: 300 | 90 days supply | Qty: 90 | Fill #0

## 2019-04-10 ENCOUNTER — Ambulatory Visit (INDEPENDENT_AMBULATORY_CARE_PROVIDER_SITE_OTHER): Payer: 59 | Admitting: Family Medicine

## 2019-04-10 ENCOUNTER — Encounter: Payer: Self-pay | Admitting: Family Medicine

## 2019-04-10 ENCOUNTER — Other Ambulatory Visit: Payer: Self-pay

## 2019-04-10 VITALS — BP 114/80 | Ht 64.0 in | Wt 125.0 lb

## 2019-04-10 DIAGNOSIS — G959 Disease of spinal cord, unspecified: Secondary | ICD-10-CM | POA: Diagnosis not present

## 2019-04-10 NOTE — Patient Instructions (Signed)
I will send you note on your labs and I will call you if there is anything really abnormal We will get MRI and f you have not heard  from me in 2 working days after  The MRI please call the office Please call with any new or worsening symptoms

## 2019-04-11 DIAGNOSIS — G959 Disease of spinal cord, unspecified: Secondary | ICD-10-CM | POA: Insufficient documentation

## 2019-04-11 LAB — BASIC METABOLIC PANEL
BUN/Creatinine Ratio: 25 — ABNORMAL HIGH (ref 9–23)
BUN: 19 mg/dL (ref 6–24)
CO2: 23 mmol/L (ref 20–29)
Calcium: 9.3 mg/dL (ref 8.7–10.2)
Chloride: 104 mmol/L (ref 96–106)
Creatinine, Ser: 0.76 mg/dL (ref 0.57–1.00)
GFR calc Af Amer: 106 mL/min/{1.73_m2} (ref >59)
GFR calc non Af Amer: 92 mL/min/{1.73_m2} (ref >59)
Glucose: 86 mg/dL (ref 65–99)
Potassium: 4.1 mmol/L (ref 3.5–5.2)
Sodium: 138 mmol/L (ref 134–144)

## 2019-04-11 LAB — VITAMIN B12: Vitamin B-12: 520 pg/mL (ref 232–1245)

## 2019-04-11 LAB — TSH: TSH: 2.68 u[IU]/mL (ref 0.450–4.500)

## 2019-04-11 NOTE — Assessment & Plan Note (Signed)
Given clumsiness and history of intermittent weakness in plantarflexion dorsiflexion on the left, I am quite concerned that she has lumbar HNP or lumbar spinal cord lesion.  We will start with MRI with contrast lumbar spine.  If negative, would consider cervical spine MRI and / or  EMG of peripheral nerves left lower extremity.  Additionally, she has had significant weight loss with a very restrictive eating pattern.  Neuropathy/myelopathy secondary to vitamin B insufficiency is also in the differential and I will check lab work for that today.  Greater than 50% of our 25-minute office visit was spent in counseling education regarding the symptoms and this work-up.  Patient appeared happy with our plan and I will discussed with her after MRI.

## 2019-04-11 NOTE — Progress Notes (Signed)
  Sheila Obrien - 48 y.o. female MRN [id number]  Date of birth: 10/25/1970    SUBJECTIVE:      Chief Complaint:/ HPI:    Complaint of 6weeks left lower extremity clumsiness.  She feels like her foot is not cooperating.  At times she feels like she needs to remind herself how to walk.  This occurs especially after she has been sitting for a while.  Several of her coworkers have noticed that she has had near falls secondary to left foot clumsiness.  She also has a new area of mild numbness on the lateral portion of the left shin.  One of her physical therapy colleagues checked her strength yesterday and she had limited plantar and dorsiflexion by report.  Since then she has had some return to normality.  Intentional weight loss on restrictive diet.  Lost 45 pounds since April 13.  [name].  Diet plan.  Long history of lumbar back issues.  She has had some increase in her lumbar pain over the last couple of months but it is not radicular and it is mostly right-sided located over the PSIS area.  ROS:     See HPI.  No abdominal pain.  No unusual paresthesias other than those listed in HPI.  No visual changes.  No sweats.  No headaches.  No rash.  PERTINENT  PMH / PSH FH / / SH:  Past Medical, Surgical, Social, and Family History Reviewed & Updated in the EMR.  Pertinent findings include:  No personal history of diabetes mellitus.  Former smoker quit 1998.  OBJECTIVE: BP 114/80   Ht 5\' 4"  (1.626 m)   Wt 125 lb (56.7 kg)   BMI 21.46 kg/m   Physical Exam:  Vital signs are reviewed. GENERAL: Well-developed female no acute distress GAIT: Normal stride length which is symmetrical except for a small circumlocution on swing phase of the left foot. MSK: 5 out of 5 symmetrical strength hip flexor extensor, knee flexor extensor, plantar flexion dorsiflexion.  Upper extremity strength 5 out of 5 C4-5-6 and 7.   NEURO: DTRs are 2-3+ bilateral symmetrical elbow forearm knee and ankle.  Intact sensation to  soft touch except for a very small area about 8 cm x 4 cm on the lateral portion of the left lower extremity. Skin: No rash. NECK: Range of motion.  Negative Spurling's.  ASSESSMENT & PLAN:  See problem based charting & AVS for pt instructions. Myelopathy (HCC) Given clumsiness and history of intermittent weakness in plantarflexion dorsiflexion on the left, I am quite concerned that she has lumbar HNP or lumbar spinal cord lesion.  We will start with MRI with contrast lumbar spine.  If negative, would consider cervical spine MRI and / or  EMG of peripheral nerves left lower extremity.  Additionally, she has had significant weight loss with a very restrictive eating pattern.  Neuropathy/myelopathy secondary to vitamin B insufficiency is also in the differential and I will check lab work for that today.  Greater than 50% of our 25-minute office visit was spent in counseling education regarding the symptoms and this work-up.  Patient appeared happy with our plan and I will discussed with her after MRI.

## 2019-04-13 NOTE — Addendum Note (Signed)
Addended by: Jolinda Croak E on: 04/13/2019 04:50 PM   Modules accepted: Orders

## 2019-04-13 NOTE — Addendum Note (Signed)
Addended by: Jolinda Croak E on: 04/13/2019 02:42 PM   Modules accepted: Orders

## 2019-04-14 ENCOUNTER — Encounter: Payer: Self-pay | Admitting: Family Medicine

## 2019-04-15 LAB — VITAMIN B1: Thiamine: 149.7 nmol/L (ref 66.5–200.0)

## 2019-04-17 LAB — VITAMIN B6: Vitamin B6: 28.6 ug/L (ref 2.0–32.8)

## 2019-05-01 ENCOUNTER — Other Ambulatory Visit: Payer: Self-pay

## 2019-05-01 ENCOUNTER — Ambulatory Visit
Admission: RE | Admit: 2019-05-01 | Discharge: 2019-05-01 | Disposition: A | Discharge status: Home / Self Care | Payer: 59 | Source: Ambulatory Visit | Attending: Family Medicine | Admitting: Family Medicine

## 2019-05-01 DIAGNOSIS — M5127 Other intervertebral disc displacement, lumbosacral region: Secondary | ICD-10-CM | POA: Diagnosis not present

## 2019-05-01 DIAGNOSIS — G959 Disease of spinal cord, unspecified: Secondary | ICD-10-CM

## 2019-05-01 MED ORDER — GADOBENATE DIMEGLUMINE 529 MG/ML IV SOLN
11.0000 mL | Freq: Once | INTRAVENOUS | Status: AC | PRN
  Administered 2019-05-01: 17:00:00 11 mL via INTRAVENOUS

## 2019-05-06 ENCOUNTER — Other Ambulatory Visit: Payer: Self-pay | Admitting: *Deleted

## 2019-05-06 DIAGNOSIS — R29898 Other symptoms and signs involving the musculoskeletal system: Secondary | ICD-10-CM

## 2019-05-12 ENCOUNTER — Encounter (INDEPENDENT_AMBULATORY_CARE_PROVIDER_SITE_OTHER): Payer: 59 | Admitting: Neurology

## 2019-05-12 ENCOUNTER — Other Ambulatory Visit: Payer: Self-pay

## 2019-05-12 ENCOUNTER — Ambulatory Visit: Payer: 59 | Admitting: Neurology

## 2019-05-12 DIAGNOSIS — G5732 Lesion of lateral popliteal nerve, left lower limb: Secondary | ICD-10-CM

## 2019-05-12 DIAGNOSIS — Z0289 Encounter for other administrative examinations: Secondary | ICD-10-CM

## 2019-05-12 DIAGNOSIS — G5730 Lesion of lateral popliteal nerve, unspecified lower limb: Secondary | ICD-10-CM | POA: Insufficient documentation

## 2019-05-12 NOTE — Progress Notes (Signed)
Full Name: Sheila Obrien Gender: Female MRN #: [id number] Date of Birth: [birth year]    Visit Date: 05/12/2019 08:55 Age: 48 Years 37 Months Old Examining Physician: Arlice Colt, MD  Referring Physician: Dorcas Mcmurray, MD     History: Sheila Obrien is a 48 year old woman with left leg weakness and numbness that started about 7 weeks ago.  Of note, she had a 40 pound weight loss in the preceding couple of months.  On examination, she has 4+/5 strength in the ankle extensors and /5 strength in the EHL.  She had altered sensation in the anterolateral lower leg.  Nerve conduction studies: The left peroneal motor response had reduced amplitude with slowing above the fibular head.  The right peroneal and left tibial motor responses had normal distal latencies, amplitudes and conduction velocities.  The left superficial peroneal sensory response was absent.  The left sural and right superficial peroneal sensory responses were normal.  Electromyography: Needle EMG of selected muscles of the left leg showed mild chronic denervation in the peroneal innervated tibialis anterior, peroneus longus and extensor hallucis longus muscles.  Additionally there was mild acute denervation in the left peroneus longus and extensor hallucis longus muscles.  Other muscles tested were normal.  Impression: This NCV/EMG study shows a mild subacute left peroneal neuropathy.  There did not appear to be any superimposed radiculopathy in the left leg.  Richard A. Felecia Shelling, MD, PhD, FAAN Certified in Neurology, Clinical Neurophysiology, Sleep Medicine, Pain Medicine and Neuroimaging Director, [place] [facility] [address] [place], [place] [address] [phone]   .    [place]    Nerve / Sites Muscle Latency Ref. Amplitude Ref. Rel Amp Segments Distance Velocity Ref. Area    ms ms mV mV %  cm m/s m/s mVms  L Peroneal - EDB   Ankle EDB 5.5 ?6.5 4.2 ?2.0 100 Ankle - EDB 9   18.9     Fib head EDB 11.6  3.8  90.5 Fib head - Ankle 27 45 ?44 17.9     Pop fossa EDB 14.7  1.8  48.4 Pop fossa - Fib head 10 32 ?44 8.8         Pop fossa - Ankle      R Peroneal - EDB     Ankle EDB 4.9 ?6.5 7.2 ?2.0 100 Ankle - EDB 9   23.5     Fib head EDB 10.8  7.0  96.9 Fib head - Ankle 28 48 ?44 24.4     Pop fossa EDB 12.8  6.8  97.1 Pop fossa - Fib head 10 51 ?44 23.9         Pop fossa - Ankle      L Tibial - AH     Ankle AH 4.1 ?5.8 18.9 ?4.0 100 Ankle - AH 9   48.4     Pop fossa AH 12.1  15.1  80 Pop fossa - Ankle 34 43 ?41 46.1           SNC    Nerve / Sites Rec. Site Peak Lat Ref.  Amp Ref. Segments Distance    ms ms V V  cm  L Sural - Ankle (Calf)     Calf Ankle 2.5 ?4.4 6 ?6 Calf - Ankle 14  L Superficial peroneal - Ankle     Lat leg Ankle NR ?4.4 NR ?6 Lat leg - Ankle 14  R Superficial peroneal - Ankle     Lat leg Ankle 4.1 ?4.4 6 ?6 Lat leg - Ankle 14           F  Wave    Nerve F Lat Ref.   ms ms  L Tibial - AH 47.6 ?56.0       EMG full       EMG Summary Table    Spontaneous MUAP Recruitment  Muscle IA Fib PSW Fasc Other Amp Dur. Poly Pattern  L. Vastus medialis Normal None None None _______ Normal Normal Normal Normal  L. Tibialis anterior Normal None None None _______ Normal Normal 1+ Reduced  L. Peroneus longus Normal 1+ 1+ None _______ Normal Normal 1+ Reduced  L. Gastrocnemius (Medial head) Normal None None None _______ Normal Normal Normal Normal  L. Extensor hallucis longus Normal 1+ 1+ None _______ Normal Increased 1+ Reduced  L. Abductor hallucis Normal None None None _______ Normal Normal Normal Normal  L. Gluteus medius Normal None None None _______ Normal Normal Normal Normal  L. Iliopsoas Normal None None None _______ Normal Normal Normal Normal

## 2019-05-29 MED FILL — ESCITALOPRAM 20 MG TABLET: 20 | 90 days supply | Qty: 90 | Fill #1

## 2019-06-23 MED FILL — buPROPion HCL ER (XL) 300 M: 300 | 90 days supply | Qty: 90 | Fill #1

## 2019-07-15 DIAGNOSIS — Z1231 Encounter for screening mammogram for malignant neoplasm of breast: Secondary | ICD-10-CM | POA: Diagnosis not present

## 2019-07-15 LAB — HM MAMMOGRAPHY

## 2019-07-28 DIAGNOSIS — R922 Inconclusive mammogram: Secondary | ICD-10-CM | POA: Diagnosis not present

## 2019-07-28 DIAGNOSIS — N6489 Other specified disorders of breast: Secondary | ICD-10-CM | POA: Diagnosis not present

## 2019-07-28 LAB — HM MAMMOGRAPHY

## 2019-07-29 ENCOUNTER — Encounter: Payer: Self-pay | Admitting: *Deleted

## 2019-08-05 ENCOUNTER — Encounter: Payer: Self-pay | Admitting: Family Medicine

## 2019-08-28 MED FILL — ESCITALOPRAM 20 MG TABLET: 20 | 90 days supply | Qty: 90 | Fill #2

## 2019-09-22 MED FILL — BUPROPION HCL ER (XL) 300 M: 300 | 90 days supply | Qty: 90 | Fill #2

## 2019-11-02 MED FILL — buPROPion HCL ER (XL) 300 M: 300 | 30 days supply | Qty: 30 | Fill #3

## 2019-12-09 DIAGNOSIS — H40013 Open angle with borderline findings, low risk, bilateral: Secondary | ICD-10-CM | POA: Diagnosis not present

## 2019-12-27 IMAGING — MR MR LUMBAR SPINE WO/W CM
7 series · 47 of 48 positions shown · IV contrast (multihance)
Comparison: None.

CLINICAL DATA: Low back pain with left lower extremity weakness

EXAM:
MRI LUMBAR SPINE WITHOUT AND WITH CONTRAST
TECHNIQUE: Multiplanar and multiecho pulse sequences of the lumbar spine were
obtained without and with intravenous contrast.
CONTRAST:  11mL MULTIHANCE GADOBENATE DIMEGLUMINE 529 MG/ML IV SOLN

[Series 2: STIR · sagittal · 4.0mm · 0.55mm/px · 3 of 12 slices shown]
[im 1/12]
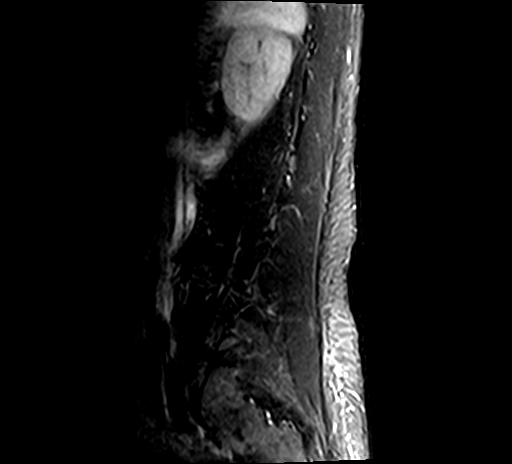
[im 6/12]
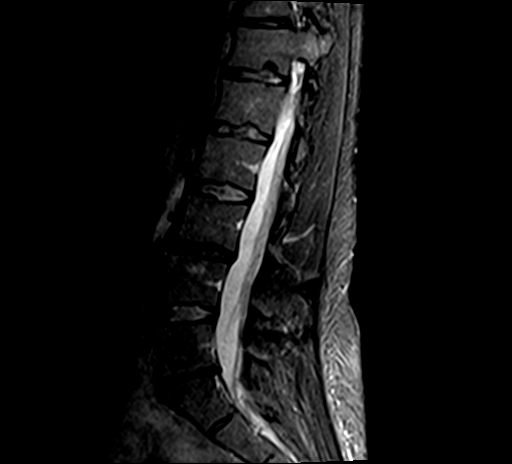
[im 12/12]
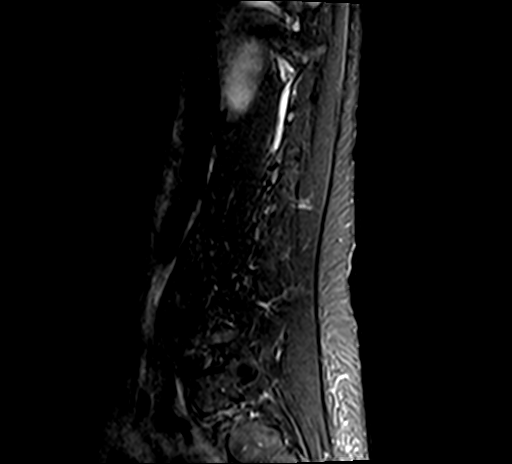

[Series 3: T1 · sagittal · 4.0mm · 0.88mm/px · 4 of 12 slices shown (1 of 2)]
[im 1/12]
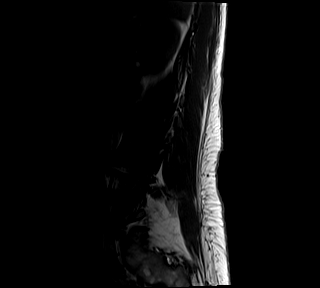
[im 4/12]
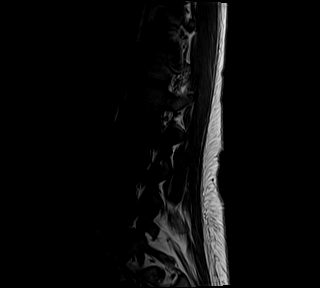
[im 8/12]
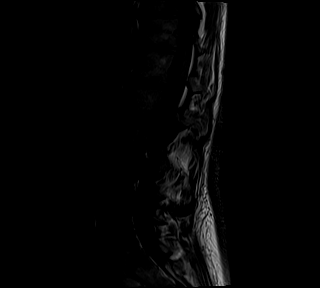
[im 12/12]
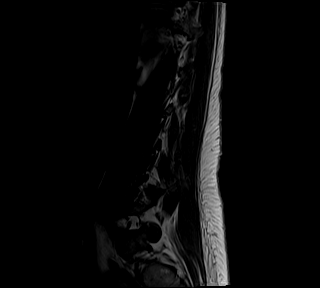

[Series 4: T1 · axial · 4.0mm · 0.74mm/px · z∈[-122,+75]mm · 11 of 33 slices shown (2 of 2)]
[im 1/33]
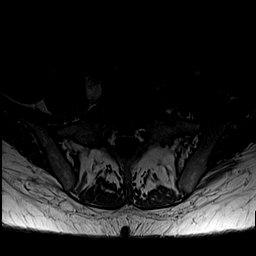
[im 4/33]
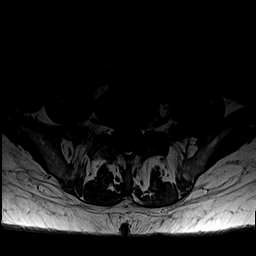
[im 7/33]
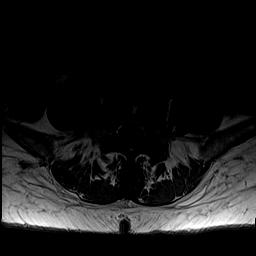
[im 10/33]
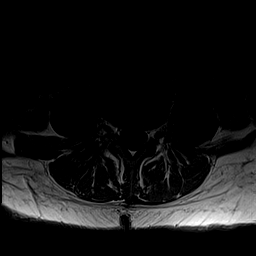
[im 13/33]
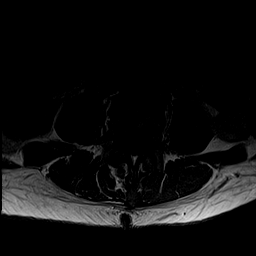
[im 17/33]
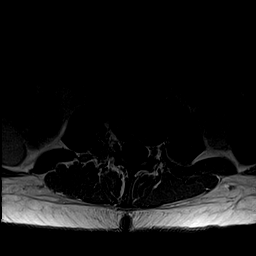
[im 20/33]
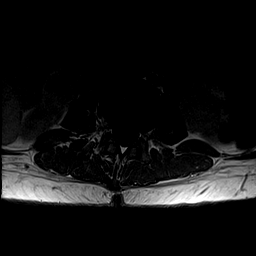
[im 23/33]
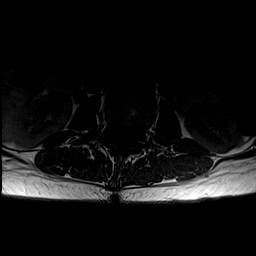
[im 26/33]
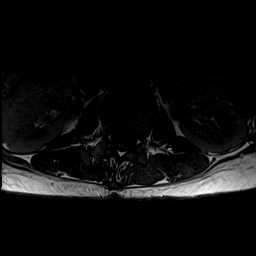
[im 29/33]
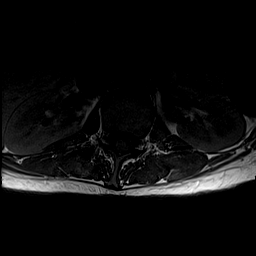
[im 33/33]
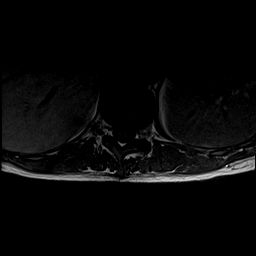

[Series 5: T2 · axial · 4.0mm · 0.74mm/px · z∈[-122,+75]mm · 11 of 33 slices shown]
[im 1/33]
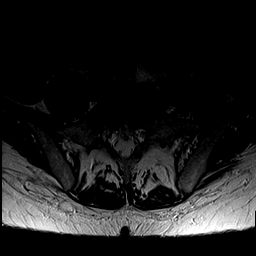
[im 4/33]
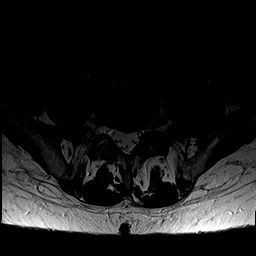
[im 7/33]
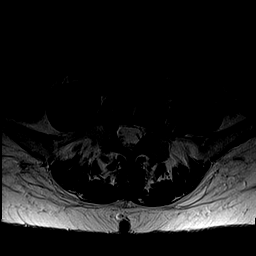
[im 10/33]
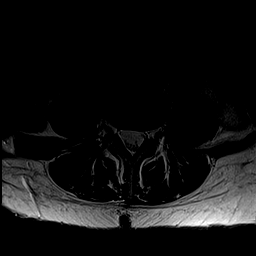
[im 13/33]
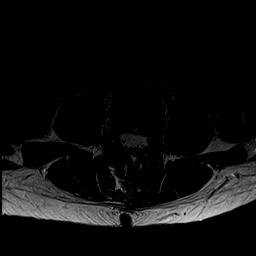
[im 17/33]
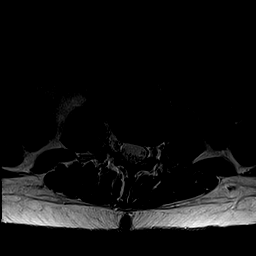
[im 20/33]
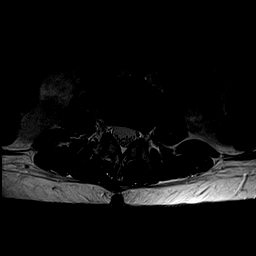
[im 23/33]
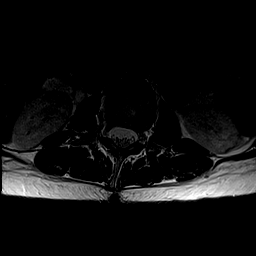
[im 26/33]
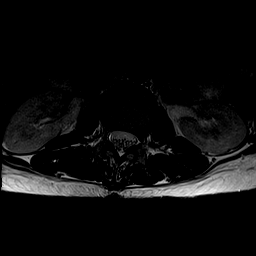
[im 29/33]
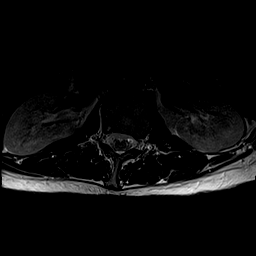
[im 33/33]
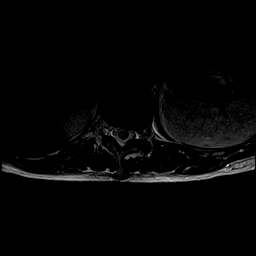

[Series 6: T2 post-contrast · sagittal · 4.0mm · 0.88mm/px · 4 of 12 slices shown]
[im 1/12]
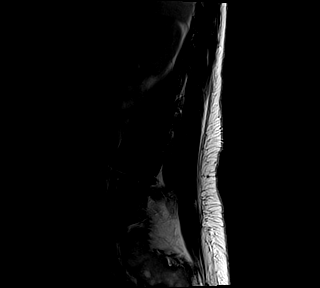
[im 4/12]
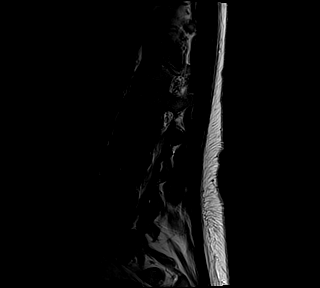
[im 8/12]
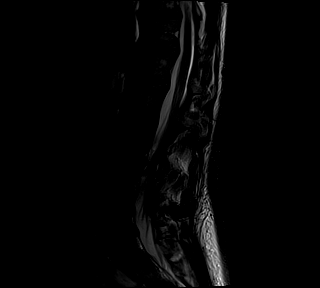
[im 12/12]
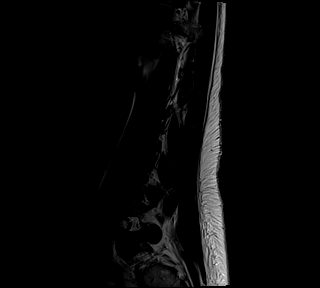

[Series 7: sag post · sagittal · 4.0mm · 0.55mm/px · 4 of 12 slices shown]
[im 1/12]
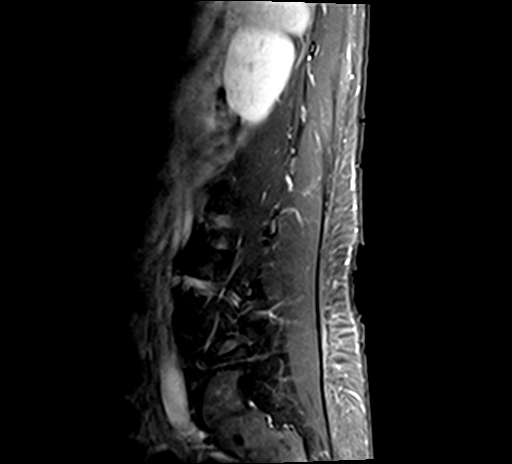
[im 4/12]
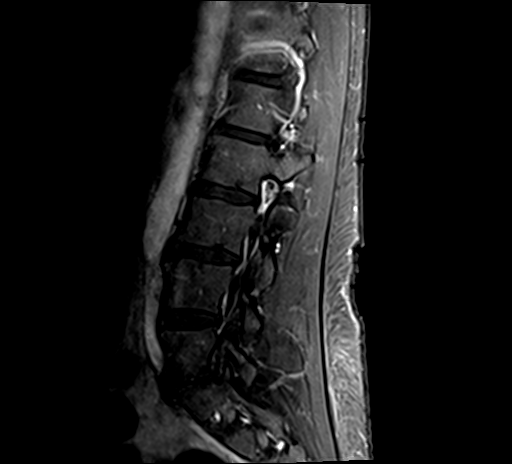
[im 8/12]
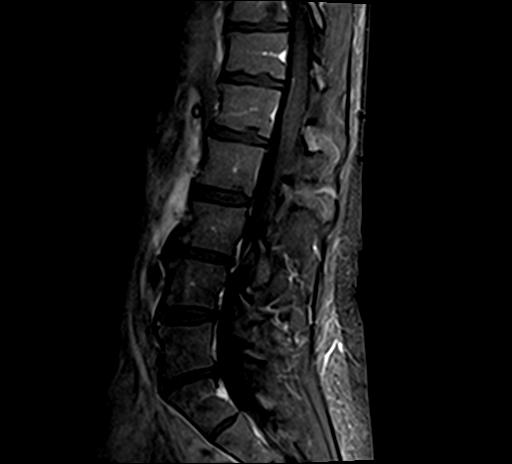
[im 12/12]
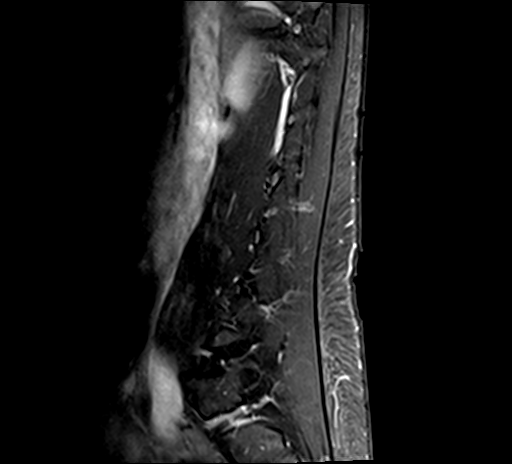

[Series 8: axial post · axial · 4.0mm · 0.74mm/px · z∈[-122,+75]mm · 10 of 33 slices shown]
[im 1/33]
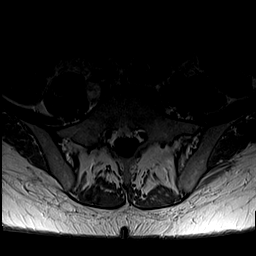
[im 4/33]
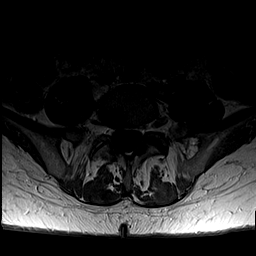
[im 7/33]
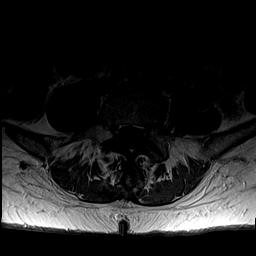
[im 10/33]
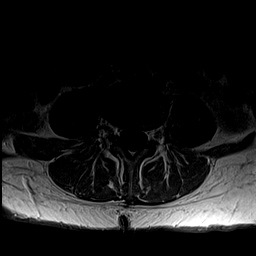
[im 13/33]
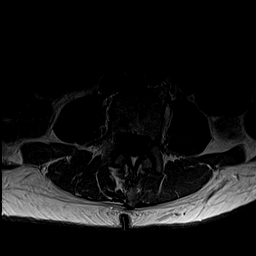
[im 17/33]
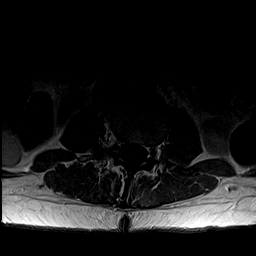
[im 20/33]
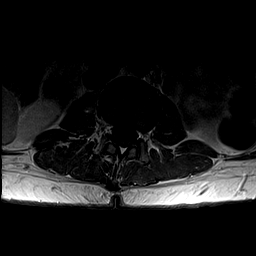
[im 23/33]
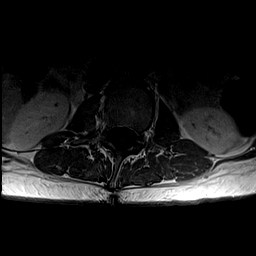
[im 26/33]
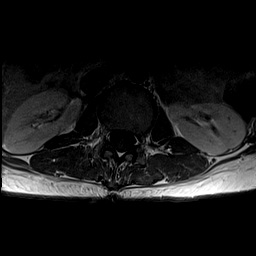
[im 33/33]
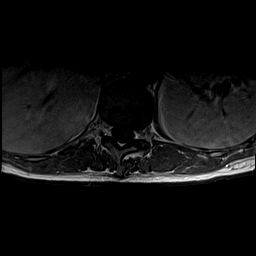

[47 of 48 positions shown; findings below may reference images not displayed]

FINDINGS: Segmentation:  Standard.

Alignment:  Physiologic.

Vertebrae:  No fracture, evidence of discitis, or bone lesion.

Conus medullaris and cauda equina: Conus extends to the L2 level.
Conus and cauda equina appear normal.

Paraspinal and other soft tissues: Negative.

Disc levels:

T11-12: No spinal canal or neural foraminal stenosis. No disc
herniation.

T12-L1: Normal disc space and facets. No spinal canal or
neuroforaminal stenosis.

L1-L2: Normal disc space and facets. No spinal canal or
neuroforaminal stenosis.

L2-L3: Normal disc space and facets. No spinal canal or
neuroforaminal stenosis.

L3-L4: Mild disc desiccation. No spinal canal or neural foraminal
stenosis.

L4-L5: Normal disc space and facets. No spinal canal or
neuroforaminal stenosis.

L5-S1: Small central disc protrusion without spinal canal stenosis.
There is moderate left foraminal stenosis.

Visualized sacrum: Normal.
IMPRESSION: 1. Small central disc protrusion at L5-S1 without spinal canal
stenosis. Moderate left neural foraminal stenosis at this level.
2. Otherwise normal lumbar spine.

## 2020-01-18 DIAGNOSIS — R05 Cough: Secondary | ICD-10-CM | POA: Diagnosis not present

## 2020-02-02 NOTE — Patient Instructions (Addendum)
  HEALTH MAINTENANCE RECOMMENDATIONS:  It is recommended that you get at least 30 minutes of aerobic exercise at least 5 days/week (for weight loss, you may need as much as 60-90 minutes). This can be any activity that gets your heart rate up. This can be divided in 10-15 minute intervals if needed, but try and build up your endurance at least once a week.  Weight bearing exercise is also recommended twice weekly.  Eat a healthy diet with lots of vegetables, fruits and fiber.  "Colorful" foods have a lot of vitamins (ie green vegetables, tomatoes, red peppers, etc).  Limit sweet tea, regular sodas and alcoholic beverages, all of which has a lot of calories and sugar.  Up to 1 alcoholic drink daily may be beneficial for women (unless trying to lose weight, watch sugars).  Drink a lot of water.  Calcium recommendations are 1200-1500 mg daily (1500 mg for postmenopausal women or women without ovaries), and vitamin D 1000 IU daily.  This should be obtained from diet and/or supplements (vitamins), and calcium should not be taken all at once, but in divided doses.  Monthly self breast exams and yearly mammograms for women over the age of 53 is recommended.  Sunscreen of at least SPF 30 should be used on all sun-exposed parts of the skin when outside between the hours of 10 am and 4 pm (not just when at beach or pool, but even with exercise, golf, tennis, and yard work!)  Use a sunscreen that says "broad spectrum" so it covers both UVA and UVB rays, and make sure to reapply every 1-2 hours.  Remember to change the batteries in your smoke detectors when changing your clock times in the spring and fall. Carbon monoxide detectors are recommended for your home.  Use your seat belt every time you are in a car, and please drive safely and not be distracted with cell phones and texting while driving.  I recommend getting the new shingles vaccine (Shingrix). You will need to check with your insurance to see if it  is covered, and if covered, schedule a nurse visit when convenient.  It is a series of 2 injections, spaced 2 months apart.  Colon cancer screening is now due. Call Dr. Lorie Apley office to set up an appointment, also to discuss your GI complaints.   Be sure to schedule your GYN exam with Dr. Willis Modena!

## 2020-02-02 NOTE — Progress Notes (Signed)
Chief Complaint  Patient presents with  . Annual Exam    fasting annual exam. Has not gone back to gyn yet but is going to go. Has eye exam Monday. No new concerns,    Sheila Obrien is a 49 y.o. female who presents for a complete physical.  She has the following concerns:  She saw Dr. Dorcas Mcmurray 03/2019 with complaints of clumsiness in LLE.  MRI was performed which showed: IMPRESSION: 1. Small central disc protrusion at L5-S1 without spinal canal stenosis. Moderate left neural foraminal stenosis at this level. 2. Otherwise normal lumbar spine.  She was then referred for EMG/NCV, which showed left peroneal neuropathy. Symptoms have completely resolved--no further foot drop or tripping. She denies back pain (just some occasional SI pain). She gets some positional tingling in her left leg/foot.  Depression and anxiety: onLexaproand Wellbutrinfor many years,continues to do well on this, without any side effects.Has tried coming off multiple times in the past, with recurrences (while under the care of Dr. Toy Care). Prefers to stay on this medication. (She no longer sees Dr. Toy Care, doesn't take her insurance.)  She worries about her son--he is going to be a junior in [place], seems to have some social anxiety, not many friends, prefers to be alone.  Denies any concerning behaviors, just wishes he was more social.  Vitamin D deficiency: Last level was 39.2 in 01/2019.  Was 23.7 in 2019, 22 in 2018.  She doesn't drink milk or eat a lot of cheese, just some almondmilk. She is not currently taking vitamins, but thinks she gets it through the [place] (had normal level when on the program last year). She had been on maintenance, but is back to "fueling" with supplements after eating poorly for a bit.  H/o abdominal bloating. She saw Dr. Mallie Mussel resolved after stopping artificial sweeteners. Some foods on her current diet make her feel a little bloated. She states her symptoms are the same as last  year--some intermittent bloating (unable to determine her triggers), and constipation, with small hard stools.  She feels like she gets plenty of fiber her in her diet and drinks a lot of water.  No blood or mucus in her stool.  She previously lost weight using [employer] app, but regained it.  She then lost it again using [employer]. She has maintained her weight loss and is a [employer] manager.  Immunization History  Administered Date(s) Administered  . Hepatitis B 01/07/1996, 02/12/1996, 11/19/1996  . Influenza Split 03/18/2013, 03/12/2014, 03/13/2016  . PFIZER SARS-COV-2 Vaccination 06/06/2019, 06/25/2019  . Td 06/18/1998  . Tdap 04/11/2009, 01/21/2019   She gets flu shots annually.  Last Pap smear:Through GYN(in 2017, has seen him once since, past due) Last mammogram: 06/2019 (and additional views and ultrasound in Feb) Last colonoscopy: never  Last DEXA: never  Dentist: twice yearly  Ophtho: yearly, wears contacts and glasses.  She has been told she has borderline glaucoma Exercise:"nothing" Lipids: Lab Results  Component Value Date   CHOL 213 (H) 01/16/2018   HDL 70 01/16/2018   LDLCALC 130 (H) 01/16/2018   TRIG 63 01/16/2018   CHOLHDL 3.0 01/16/2018    PMH, PSH, SH and FH were reviewed and updated  Outpatient Encounter Medications as of 02/03/2020  Medication Sig  . buPROPion (WELLBUTRIN XL) 300 MG 24 hr tablet Take 1 tablet (300 mg total) by mouth daily.  Marland Kitchen escitalopram (LEXAPRO) 20 MG tablet Take 1 tablet (20 mg total) by mouth daily.  Marland Kitchen triamcinolone cream (KENALOG) 0.1 %  Apply 1 application topically 2 (two) times daily as needed. (Patient not taking: Reported on 01/21/2019)   No facility-administered encounter medications on file as of 02/03/2020.   Allergies  Allergen Reactions  . Penicillins Hives    ROS: The patient denies anorexia, fever,vision changes, ear pain, sore throat, chest pain, palpitations, dizziness, syncope, dyspnea on exertion, cough, swelling,diarrhea,  constipation, abdominal pain, melena, hematochezia, indigestion/heartburn, hematuria, incontinence, dysuria, irregular menstrual cycles, vaginal discharge, or odor, genital lesions, numbness, tingling, weakness, tremor, suspicious skin lesions, depression, anxiety, abnormal bleeding/bruising, or enlarged lymph nodes.  Chronic hearing loss L ear since childhood. No tinnitus.Thinks it might have gotten a little worse (since we are wearing masks it is more noticeable). No change in the last year Constipation and some bloating, per HPI  eczma on hands, wrist, intermittent. Some chronic, mild back pain (SI problems) since age of 49.  Hasn't flared in a long time. No hot flashes (had some, but got better, has had just a few recently). Still having cycles, but are getting less regular.   PHYSICAL EXAM:  BP 110/60   Pulse 64   Ht 5\' 4"  (1.626 m)   Wt 123 lb 12.8 oz (56.2 kg)   LMP 01/05/2020   BMI 21.25 kg/m   Wt Readings from Last 3 Encounters:  02/03/20 123 lb 12.8 oz (56.2 kg)  04/10/19 125 lb (56.7 kg)  01/21/19 144 lb (65.3 kg)     General Appearance:  Alert, cooperative, no distress, appears stated age   Head:  Normocephalic, without obvious abnormality, atraumatic   Eyes:  PERRL, conjunctiva/corneas clear, EOM's intact, fundi benign   Ears:  Normal TM's and external ear canals   Nose:  Not examined (wearing mask due to COVID-19)  Throat:  Not examined (wearing mask due to COVID-19)  Neck:  Supple, no lymphadenopathy; thyroid: no enlargement/ tenderness/nodules; no carotid bruit or JVD   Back:  Spine nontender, no curvature, ROM normal, no CVA tenderness   Lungs:  Clear to auscultation bilaterally without wheezes, rales or ronchi; respirations unlabored   Chest Wall:  No tenderness or deformity. Some prominence of R [place] joint (since childhood, per pt, unchanged)   Heart:  Regular rate and rhythm, S1 and S2 normal, no murmur, rub or gallop   Breast Exam:  Deferred to GYN  Abdomen:   Soft, non-tender, nondistended, normoactive bowel sounds,  no masses, no hepatosplenomegaly   Genitalia:  Deferred to GYN      Extremities:  No clubbing, cyanosis or edema.   Pulses:  2+ and symmetric all extremities   Skin:  Skin color, texture, turgor normal, no lesions. Some hyperpigmentation/actinic changes to skin on her legs.  Lymph nodes:  Cervical, supraclavicular, and axillary nodes normal   Neurologic:  CNII-XII intact, normal strength, sensation and gait; reflexes 2+ and symmetric throughout   Psych: Normal mood, affect, hygiene and grooming.   PHQ-9 score of 0 GAD-7 score of 1 (situational, related to stressors with son's school schedule)   ASSESSMENT/PLAN:  Annual physical exam - Plan: CBC with Differential/Platelet, Comprehensive metabolic panel, Lipid panel, VITAMIN D 25 Hydroxy (Vit-D Deficiency, Fractures), POCT Urinalysis DIP (Proadvantage Device), CANCELED: POCT Urinalysis Dipstick  Vitamin D deficiency - Plan: VITAMIN D 25 Hydroxy (Vit-D Deficiency, Fractures)  Depression, major, in remission ([place]) - Plan: escitalopram (LEXAPRO) 20 MG tablet, buPROPion (WELLBUTRIN XL) 300 MG 24 hr tablet  Generalized anxiety disorder - Plan: escitalopram (LEXAPRO) 20 MG tablet, buPROPion (WELLBUTRIN XL) 300 MG 24 hr tablet  Colon cancer  screening - pt to contact Dr. Collene Mares to schedule OV to discuss her bloating and GI complaints, and to schedule colonoscopy   Past due for GYN--she has filled out the paperwork and plans to call to schedule visit.  wories some about her son--no friends, not social, not unhappy. Briefly discussed social anxiety, what is normal, her expectations vs his.  Discussed monthly self breast exams and yearly mammograms; at least 30 minutes of aerobic activity at least 5 days/week, weight-bearing exercise at least 2x/week; proper sunscreen use reviewed; healthy diet, including goals of calcium and vitamin D intake and alcohol  recommendations (less than or equal to 1 drink/day) reviewed; regular seatbelt use; changing batteries in smoke detectors. Immunization recommendations discussed--continue yearly flu shots. Shingrix is recommended (to check insurance and schedule [place] when convenient).  Discussed new recommendation for COVID vaccine booster 8 months after 2nd dose (starting in September). Colonoscopy recommendations reviewed, due now. Pt to contact Dr. Collene Mares to schedule.  F/u 1 year, sooner prn.

## 2020-02-03 ENCOUNTER — Other Ambulatory Visit: Payer: Self-pay

## 2020-02-03 ENCOUNTER — Ambulatory Visit (INDEPENDENT_AMBULATORY_CARE_PROVIDER_SITE_OTHER): Payer: 59 | Admitting: Family Medicine

## 2020-02-03 ENCOUNTER — Other Ambulatory Visit: Payer: Self-pay | Admitting: Family Medicine

## 2020-02-03 ENCOUNTER — Encounter: Payer: Self-pay | Admitting: Family Medicine

## 2020-02-03 VITALS — BP 110/60 | HR 64 | Ht 64.0 in | Wt 123.8 lb

## 2020-02-03 DIAGNOSIS — Z1211 Encounter for screening for malignant neoplasm of colon: Secondary | ICD-10-CM

## 2020-02-03 DIAGNOSIS — F325 Major depressive disorder, single episode, in full remission: Secondary | ICD-10-CM

## 2020-02-03 DIAGNOSIS — F411 Generalized anxiety disorder: Secondary | ICD-10-CM

## 2020-02-03 DIAGNOSIS — Z0000 Encounter for general adult medical examination without abnormal findings: Secondary | ICD-10-CM

## 2020-02-03 DIAGNOSIS — R140 Abdominal distension (gaseous): Secondary | ICD-10-CM

## 2020-02-03 DIAGNOSIS — E559 Vitamin D deficiency, unspecified: Secondary | ICD-10-CM

## 2020-02-03 LAB — POCT URINALYSIS DIP (PROADVANTAGE DEVICE)
Bilirubin, UA: NEGATIVE
Glucose, UA: NEGATIVE mg/dL
Ketones, POC UA: NEGATIVE mg/dL
Leukocytes, UA: NEGATIVE
Nitrite, UA: NEGATIVE
Protein Ur, POC: NEGATIVE mg/dL
Specific Gravity, Urine: 1.015
Urobilinogen, Ur: NEGATIVE
pH, UA: 7 (ref 5.0–8.0)

## 2020-02-03 MED ORDER — BUPROPION HCL ER (XL) 300 MG PO TB24: 300.0000 mg | ORAL_TABLET | Freq: Every day | ORAL | 3 refills | Status: DC

## 2020-02-03 MED ORDER — ESCITALOPRAM OXALATE 20 MG PO TABS: 20.0000 mg | ORAL_TABLET | Freq: Every day | ORAL | 3 refills | Status: DC

## 2020-02-03 MED FILL — buPROPion HCL ER (XL) 300 M: 300 | 90 days supply | Qty: 90 | Fill #0

## 2020-02-04 LAB — CBC WITH DIFFERENTIAL/PLATELET
Basophils Absolute: 0.1 10*3/uL (ref 0.0–0.2)
Basos: 1 %
EOS (ABSOLUTE): 0.1 10*3/uL (ref 0.0–0.4)
Eos: 1 %
Hematocrit: 41.2 % (ref 34.0–46.6)
Hemoglobin: 13.9 g/dL (ref 11.1–15.9)
Immature Grans (Abs): 0 10*3/uL (ref 0.0–0.1)
Immature Granulocytes: 0 %
Lymphocytes Absolute: 1.3 10*3/uL (ref 0.7–3.1)
Lymphs: 23 %
MCH: 32.8 pg (ref 26.6–33.0)
MCHC: 33.7 g/dL (ref 31.5–35.7)
MCV: 97 fL (ref 79–97)
Monocytes Absolute: 0.5 10*3/uL (ref 0.1–0.9)
Monocytes: 8 %
Neutrophils Absolute: 3.7 10*3/uL (ref 1.4–7.0)
Neutrophils: 67 %
Platelets: 225 10*3/uL (ref 150–450)
RBC: 4.24 x10E6/uL (ref 3.77–5.28)
RDW: 11.8 % (ref 11.7–15.4)
WBC: 5.6 10*3/uL (ref 3.4–10.8)

## 2020-02-04 LAB — COMPREHENSIVE METABOLIC PANEL
ALT: 27 IU/L (ref 0–32)
AST: 16 IU/L (ref 0–40)
Albumin/Globulin Ratio: 2 (ref 1.2–2.2)
Albumin: 4.5 g/dL (ref 3.8–4.8)
Alkaline Phosphatase: 47 IU/L — ABNORMAL LOW (ref 48–121)
BUN/Creatinine Ratio: 20 (ref 9–23)
BUN: 17 mg/dL (ref 6–24)
Bilirubin Total: 0.7 mg/dL (ref 0.0–1.2)
CO2: 24 mmol/L (ref 20–29)
Calcium: 9.3 mg/dL (ref 8.7–10.2)
Chloride: 103 mmol/L (ref 96–106)
Creatinine, Ser: 0.85 mg/dL (ref 0.57–1.00)
GFR calc Af Amer: 92 mL/min/{1.73_m2} (ref >59)
GFR calc non Af Amer: 80 mL/min/{1.73_m2} (ref >59)
Globulin, Total: 2.3 g/dL (ref 1.5–4.5)
Glucose: 77 mg/dL (ref 65–99)
Potassium: 5 mmol/L (ref 3.5–5.2)
Sodium: 139 mmol/L (ref 134–144)
Total Protein: 6.8 g/dL (ref 6.0–8.5)

## 2020-02-04 LAB — LIPID PANEL
Chol/HDL Ratio: 2.2 ratio (ref 0.0–4.4)
Cholesterol, Total: 197 mg/dL (ref 100–199)
HDL: 89 mg/dL (ref >39)
LDL Chol Calc (NIH): 99 mg/dL (ref 0–99)
Triglycerides: 48 mg/dL (ref 0–149)
VLDL Cholesterol Cal: 9 mg/dL (ref 5–40)

## 2020-02-04 LAB — VITAMIN D 25 HYDROXY (VIT D DEFICIENCY, FRACTURES): Vit D, 25-Hydroxy: 38.2 ng/mL (ref 30.0–100.0)

## 2020-02-08 DIAGNOSIS — H40013 Open angle with borderline findings, low risk, bilateral: Secondary | ICD-10-CM | POA: Diagnosis not present

## 2020-02-26 MED FILL — ESCITALOPRAM 20 MG TABLET: 20 | 90 days supply | Qty: 90 | Fill #0

## 2020-05-10 MED FILL — buPROPion HCL ER (XL) 300 M: 300 | 90 days supply | Qty: 90 | Fill #1

## 2020-05-19 ENCOUNTER — Telehealth (INDEPENDENT_AMBULATORY_CARE_PROVIDER_SITE_OTHER): Payer: 59 | Admitting: Family Medicine

## 2020-05-19 ENCOUNTER — Encounter: Payer: Self-pay | Admitting: Family Medicine

## 2020-05-19 ENCOUNTER — Other Ambulatory Visit: Payer: Self-pay

## 2020-05-19 VITALS — Ht 64.0 in | Wt 126.0 lb

## 2020-05-19 DIAGNOSIS — L250 Unspecified contact dermatitis due to cosmetics: Secondary | ICD-10-CM | POA: Diagnosis not present

## 2020-05-19 NOTE — Progress Notes (Signed)
Start time: 9:33 End time: 9:50  Virtual Visit via Video Note  I connected with Sheila Obrien on 05/19/20 by a video enabled telemedicine application and verified that I am speaking with the correct person using two identifiers.  Location: Patient: at work Provider: office   I discussed the limitations of evaluation and management by telemedicine and the availability of in person appointments. The patient expressed understanding and agreed to proceed.  History of Present Illness:  Chief Complaint  Patient presents with  . 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 Stopped wearing everything.    2 weeks ago she started new concealer and foundation.  2-3 days ago she noticed some rash, and stinging below her eyes, burning.  Also used a Clinique cleanser after removing her makeup. Makeup removing cloths have been used x 2 months, not new. No other new contacts.  She is complaining of itching, flaking, burning around her eyes (lids and below the eyes).  Stopped wearing all make up yesterday.  PMH, PSH, SH reviewed  Outpatient Encounter Medications as of 05/19/2020  Medication Sig  . buPROPion (WELLBUTRIN XL) 300 MG 24 hr tablet Take 1 tablet (300 mg total) by mouth daily.  Marland Kitchen escitalopram (LEXAPRO) 20 MG tablet Take 1 tablet (20 mg total) by mouth daily.  Marland Kitchen triamcinolone cream (KENALOG) 0.1 % Apply 1 application topically 2 (two) times daily as needed. (Patient not taking: Reported on 01/21/2019)   No facility-administered encounter medications on file as of 05/19/2020.   Allergies  Allergen Reactions  . Penicillins Hives   ROS: no fever, chills, rashes elsewhere on body.  No other complaints.    Observations/Objective:  Ht 5\' 4"  (1.626 m)   Wt 126 lb (57.2 kg)   LMP 05/12/2020 (Exact Date)   BMI 21.63 kg/m   Hyperpigmented on eyelids (mostly at the skin fold, not near the lashes), and mostly under  eyes--slightly pink, flaky.  Remainder of the face appears normal.   Assessment and Plan:  Contact dermatitis due to cosmetics, unspecified contact dermatitis type - stop using those products; 1% hydrocortisone cream BID-TID. To send photos/message if worsening. Ddx reviewed   Continue to avoid the use of the concealer, foundation and the [place] product. Wash your face with mild soap and water. Use 1% hydrocortisone cream 2-3 times daily if needed for itching, inflammation. Use a gentle moisturizer (cetaphil, aquaphor), if needed.  Once improved, resume your prior makeup.  Based on location of the rash, seems more like concealer rather than foundation, since the rest of the face is spared, but use caution in restarting, if desired (try small area first).  We briefly discussed the potential use of a stronger cream (2.5% hydrocortisone) if not getting good enough response to the over-the-counter 1% strength.    Follow Up Instructions:    I discussed the assessment and treatment plan with the patient. The patient was provided an opportunity to ask questions and all were answered. The patient agreed with the plan and demonstrated an understanding of the instructions.   The patient was advised to call back or seek an in-person evaluation if the symptoms worsen or if the condition fails to improve as anticipated.  I spent 20 minutes dedicated to the care of this patient, including pre-visit review of records, face to face time, and documentation.    Vikki Ports, MD

## 2020-05-19 NOTE — Patient Instructions (Signed)
Continue to avoid the use of the concealer, foundation and the [place] product. Wash your face with mild soap and water. Use 1% hydrocortisone cream 2-3 times daily if needed for itching, inflammation. Use a gentle moisturizer (cetaphil, aquaphor), if needed.  Once improved, resume your prior makeup.  Based on location of the rash, seems more like concealer rather than foundation, since the rest of the face is spared, but use caution in restarting, if desired (try small area first).  We briefly discussed the potential use of a stronger cream (2.5% hydrocortisone) if not getting good enough response to the over-the-counter 1% strength.

## 2020-05-25 MED FILL — ESCITALOPRAM 20 MG TABLET: 20 | 90 days supply | Qty: 90 | Fill #1

## 2020-07-20 DIAGNOSIS — Z1231 Encounter for screening mammogram for malignant neoplasm of breast: Secondary | ICD-10-CM | POA: Diagnosis not present

## 2020-07-20 LAB — HM MAMMOGRAPHY

## 2020-07-28 ENCOUNTER — Encounter: Payer: Self-pay | Admitting: Family Medicine

## 2020-07-28 DIAGNOSIS — Z1211 Encounter for screening for malignant neoplasm of colon: Secondary | ICD-10-CM | POA: Diagnosis not present

## 2020-07-28 DIAGNOSIS — K5900 Constipation, unspecified: Secondary | ICD-10-CM | POA: Diagnosis not present

## 2020-08-02 ENCOUNTER — Other Ambulatory Visit (HOSPITAL_COMMUNITY): Payer: Self-pay | Admitting: Gastroenterology

## 2020-08-02 MED FILL — CLENPIQ 10-3.5-12 MG-GM -GM: 10-3.5-12 M | 1 days supply | Qty: 320 | Fill #0

## 2020-08-10 MED FILL — buPROPion HCL ER (XL) 300 M: 300 | 90 days supply | Qty: 90 | Fill #2

## 2020-08-23 MED FILL — ESCITALOPRAM 20 MG TABLET: 20 | 90 days supply | Qty: 90 | Fill #2

## 2020-09-14 DIAGNOSIS — Z1211 Encounter for screening for malignant neoplasm of colon: Secondary | ICD-10-CM | POA: Diagnosis not present

## 2020-09-14 DIAGNOSIS — K5900 Constipation, unspecified: Secondary | ICD-10-CM | POA: Diagnosis not present

## 2020-09-15 ENCOUNTER — Encounter: Payer: Self-pay | Admitting: Family Medicine

## 2020-09-21 ENCOUNTER — Other Ambulatory Visit (HOSPITAL_COMMUNITY): Payer: Self-pay

## 2020-09-21 MED ORDER — PEG 3350-KCL-NA BICARB-NACL 420 G PO SOLR
Freq: Once | ORAL | 0 refills | Status: AC
  Filled 2020-09-21 – 2020-10-17 (×2): qty 4000, 1d supply, fill #0

## 2020-10-04 ENCOUNTER — Other Ambulatory Visit (HOSPITAL_COMMUNITY): Payer: Self-pay

## 2020-10-17 ENCOUNTER — Other Ambulatory Visit (HOSPITAL_COMMUNITY): Payer: Self-pay

## 2020-10-19 DIAGNOSIS — Z1211 Encounter for screening for malignant neoplasm of colon: Secondary | ICD-10-CM | POA: Diagnosis not present

## 2020-11-10 ENCOUNTER — Other Ambulatory Visit (HOSPITAL_COMMUNITY): Payer: Self-pay

## 2020-11-10 MED ORDER — TRULANCE 3 MG PO TABS
3.0000 mg | ORAL_TABLET | Freq: Every day | ORAL | 4 refills | Status: DC
Start: 2020-11-10 — End: 2021-02-06
  Filled 2020-11-10: qty 90, 90d supply, fill #0
  Filled 2020-11-21 – 2020-11-23 (×2): qty 30, 30d supply, fill #0

## 2020-11-15 ENCOUNTER — Other Ambulatory Visit (HOSPITAL_COMMUNITY): Payer: Self-pay

## 2020-11-15 MED FILL — Escitalopram Oxalate Tab 20 MG (Base Equiv): ORAL | 90 days supply | Qty: 90 | Fill #0 | Status: AC

## 2020-11-15 MED FILL — Bupropion HCl Tab ER 24HR 300 MG: ORAL | 90 days supply | Qty: 90 | Fill #0 | Status: AC

## 2020-11-21 ENCOUNTER — Other Ambulatory Visit (HOSPITAL_COMMUNITY): Payer: Self-pay

## 2020-11-23 ENCOUNTER — Other Ambulatory Visit (HOSPITAL_COMMUNITY): Payer: Self-pay

## 2020-12-12 DIAGNOSIS — H40013 Open angle with borderline findings, low risk, bilateral: Secondary | ICD-10-CM | POA: Diagnosis not present

## 2020-12-12 DIAGNOSIS — Z1212 Encounter for screening for malignant neoplasm of rectum: Secondary | ICD-10-CM | POA: Diagnosis not present

## 2020-12-12 DIAGNOSIS — Z1211 Encounter for screening for malignant neoplasm of colon: Secondary | ICD-10-CM | POA: Diagnosis not present

## 2020-12-26 ENCOUNTER — Other Ambulatory Visit (HOSPITAL_COMMUNITY): Payer: Self-pay

## 2020-12-26 MED ORDER — PEG 3350-KCL-NABCB-NACL-NASULF 236 G PO SOLR
4000.0000 mL | Freq: Once | ORAL | 0 refills | Status: AC
  Filled 2020-12-26 – 2021-01-09 (×2): qty 4000, 1d supply, fill #0

## 2021-01-03 ENCOUNTER — Other Ambulatory Visit (HOSPITAL_COMMUNITY): Payer: Self-pay

## 2021-01-09 ENCOUNTER — Other Ambulatory Visit (HOSPITAL_COMMUNITY): Payer: Self-pay

## 2021-02-05 NOTE — Patient Instructions (Addendum)
  HEALTH MAINTENANCE RECOMMENDATIONS:  It is recommended that you get at least 30 minutes of aerobic exercise at least 5 days/week (for weight loss, you may need as much as 60-90 minutes). This can be any activity that gets your heart rate up. This can be divided in 10-15 minute intervals if needed, but try and build up your endurance at least once a week.  Weight bearing exercise is also recommended twice weekly.  Eat a healthy diet with lots of vegetables, fruits and fiber.  "Colorful" foods have a lot of vitamins (ie green vegetables, tomatoes, red peppers, etc).  Limit sweet tea, regular sodas and alcoholic beverages, all of which has a lot of calories and sugar.  Up to 1 alcoholic drink daily may be beneficial for women (unless trying to lose weight, watch sugars).  Drink a lot of water.  Calcium recommendations are 1200-1500 mg daily (1500 mg for postmenopausal women or women without ovaries), and vitamin D 1000 IU daily.  This should be obtained from diet and/or supplements (vitamins), and calcium should not be taken all at once, but in divided doses.  Monthly self breast exams and yearly mammograms for women over the age of 68 is recommended.  Sunscreen of at least SPF 30 should be used on all sun-exposed parts of the skin when outside between the hours of 10 am and 4 pm (not just when at beach or pool, but even with exercise, golf, tennis, and yard work!)  Use a sunscreen that says "broad spectrum" so it covers both UVA and UVB rays, and make sure to reapply every 1-2 hours.  Remember to change the batteries in your smoke detectors when changing your clock times in the spring and fall. Carbon monoxide detectors are recommended for your home.  Use your seat belt every time you are in a car, and please drive safely and not be distracted with cell phones and texting while driving.  I recommend getting the new shingles vaccine (Shingrix). Check with your insurance to verify what your out of  pocket cost may be (usually covered as preventative, but better to verify to avoid any surprises, as this vaccine is expensive), and then schedule a nurse visit at our office when convenient (based on the possible side effects as discussed).   This is a series of 2 injections, spaced 2 months apart.  It doesn't have to be exactly 2 months apart (but can't be under 2 months), if that isn't feasible for your schedule, but try and get them close to 2 months (and definitely within 6 months of each other, or else the efficacy of the vaccine drops off).  Get the new, updated COVID vaccine when available in late Fall.  Please consider taking 1000 IU of D3 daily or a multivitamin, as you are cutting back on the [place] fuelings, which have previously provided adequate vitamin D.  You are past due for GYN exam, and due now for your pap smear.  Please schedule an appointment with Dr. Willis Modena!

## 2021-02-05 NOTE — Progress Notes (Signed)
Chief Complaint  Patient presents with   Annual Exam    Fasting annual exam no pap-sees Dr Willis Modena is past due but will schedule. Sees eye doctor yearly. Did have colonoscopy twice and scheduled again for 02/13/21. No concerns.     Sheila Obrien is a 50 y.o. female who presents for a complete physical.  She has the following concerns:  Had COVID last month (7/26), fully recovered.  Depression and anxiety: on Lexapro and Wellbutrin for many years, continues to do well on this, without any side effects, other than now being concerned that constpation could be related to medications.  Has tried coming off multiple times in the past, with recurrences (while under the care of Dr. Toy Care).  Prefers to stay on this medication. (She no longer sees Dr. Toy Care, doesn't take her insurance.)    She notes some mild recent anxiety, feeling somewhat overwhelmed (working full time, Chiropodist, kids), but is improving recently.   Vitamin D deficiency: Low levels noted in the past, 23.7 in 2019, 22 in 2018.  Last level was 38.2 in 01/2020, when using Optavia fueling supplements/bars (no separate vitamins). She doesn't drink milk or eat a lot of cheese, just some almondmilk.  Constipation and bloating.  Bloating is intermittent, was worse with use of artificial sweeteners.  She is under the care of Dr. Collene Mares and is in the process of getting colon cancer screening (scheduled for colonoscopy later this month, has had 2 with poor prep. She used Linzess prior to 1st colonoscopy (only for a few days),Trulance prior to the 2nd colonoscopy.  She then had a positive Cologuard. She is now on Motegrity, taking daily until her colonoscopy. Currently is having daily soft, formed bowel movements. She cut back on Optavia fuelings, just 1 each evening (concerned it could be contributing to constipation, and she also planned to transition again). She tries to follow a high fiber diet and drink plenty of water.  Denies any blood or  mucus in the stool.   Immunization History  Administered Date(s) Administered   Hepatitis B 01/07/1996, 02/12/1996, 11/19/1996   Influenza Split 03/18/2013, 03/12/2014, 03/13/2016   PFIZER(Purple Top)SARS-COV-2 Vaccination 06/06/2019, 06/25/2019   Td 06/18/1998   Tdap 04/11/2009, 01/21/2019   Had 1 COVID booster She gets flu shots annually.  Last Pap smear:  Through GYN (in 2017, has only seen him once since, past due) Last mammogram: 07/2020 at [facility] colonoscopy: attempted in 08/2020 by Dr. Collene Mares, only did flex-sig, aborted colonoscopy due to poor prep.  Repeated in 10/2020 --got a little further, but still aborted. She then had a + Cologuard, and is scheduled for another colonoscopy later this month. Last DEXA: never  Dentist: twice yearly  Ophtho: at least once a year, wears contacts and glasses.  She has been told she has borderline glaucoma Exercise: "nothing" Lipids: Lab Results  Component Value Date   CHOL 197 02/03/2020   HDL 89 02/03/2020   LDLCALC 99 02/03/2020   TRIG 48 02/03/2020   CHOLHDL 2.2 02/03/2020    PMH, PSH, SH and FH were reviewed and updated   Outpatient Encounter Medications as of 02/06/2021  Medication Sig Note   buPROPion (WELLBUTRIN XL) 300 MG 24 hr tablet TAKE 1 TABLET (300 MG TOTAL) BY MOUTH DAILY.    escitalopram (LEXAPRO) 20 MG tablet TAKE 1 TABLET BY MOUTH ONCE DAILY    Prucalopride Succinate (MOTEGRITY) 2 MG TABS Take 1 tablet by mouth daily. 02/06/2021: Only until colonoscopy   Sod Picosulfate-Mag  Ox-Cit Acd 10-3.5-12 MG-GM -GM/160ML SOLN TAKE AS DIRECTED (Patient not taking: Reported on 02/06/2021)    triamcinolone cream (KENALOG) 0.1 % Apply 1 application topically 2 (two) times daily as needed. (Patient not taking: No sig reported)    [DISCONTINUED] Plecanatide (TRULANCE) 3 MG TABS Take 1 tablet by mouth once daily    No facility-administered encounter medications on file as of 02/06/2021.   Allergies  Allergen Reactions   Penicillins  Hives    ROS: The patient denies anorexia, fever, vision changes, ear pain, sore throat, chest pain, palpitations, dizziness, syncope, dyspnea on exertion, cough, swelling,diarrhea, abdominal pain, melena, hematochezia, indigestion/heartburn, hematuria, incontinence, dysuria, vaginal discharge, or odor, genital lesions, numbness, tingling, weakness, tremor, suspicious skin lesions, depression, anxiety, abnormal bleeding/bruising, or enlarged lymph nodes.  Chronic hearing loss L ear since childhood (a little more noticeable with mask-wearing). No tinnitus.  No change in the last year Constipation per HPI, currenty improved on Motegrity. eczma on hands, wrist, intermittent, r/b TAC Some chronic, mild back pain (SI problems) since age of 44.  Hasn't flared in a long time. Cycles are irregular.  No recent problems with any hot flashes. Some urinary urgency   PHYSICAL EXAM:  BP 110/64   Pulse 72   Ht '5\' 4"'$  (1.626 m)   Wt 130 lb 9.6 oz (59.2 kg)   BMI 22.42 kg/m   Wt Readings from Last 3 Encounters:  02/06/21 130 lb 9.6 oz (59.2 kg)  05/19/20 126 lb (57.2 kg)  02/03/20 123 lb 12.8 oz (56.2 kg)    General Appearance:  Alert, cooperative, no distress, appears stated age   Head:  Normocephalic, without obvious abnormality, atraumatic   Eyes:  PERRL, conjunctiva/corneas clear, EOM's intact, fundi benign   Ears:  Normal TM's and external ear canals   Nose:  Not examined (wearing mask due to COVID-19)  Throat:  Not examined (wearing mask due to COVID-19)  Neck:  Supple, no lymphadenopathy; thyroid: no enlargement/ tenderness/nodules; no carotid bruit or JVD   Back:  Spine nontender, no curvature, ROM normal, no CVA tenderness   Lungs:  Clear to auscultation bilaterally without wheezes, rales or ronchi; respirations unlabored   Chest Wall:  No tenderness or deformity. Some prominence of R Gravity joint (since childhood, per pt, unchanged)   Heart:  Regular rate and rhythm, S1 and S2 normal, no  murmur, rub or gallop   Breast Exam:  Deferred to GYN  Abdomen:  Soft, non-tender, nondistended, normoactive bowel sounds, no masses, no hepatosplenomegaly   Genitalia:  Deferred to GYN        Extremities:  No clubbing, cyanosis or edema.    Pulses:  2+ and symmetric all extremities   Skin:  Skin color, texture, turgor normal, no lesions. Some hyperpigmentation/actinic changes to skin on her legs.  Lymph nodes:  Cervical, supraclavicular, and axillary nodes normal   Neurologic:  Normal strength, sensation and gait; reflexes 2+ and symmetric throughout                              Psych:  Normal mood, affect, hygiene and grooming.    PHQ-9 score of 0   ASSESSMENT/PLAN:  Annual physical exam  Depression, major, in remission ([place]) - cont current meds - Plan: buPROPion (WELLBUTRIN XL) 300 MG 24 hr tablet, escitalopram (LEXAPRO) 20 MG tablet  Generalized anxiety disorder - Cont current meds.  Do not recommend tapering (pt asked about, regarding constipation) at this time -  Plan: buPROPion (WELLBUTRIN XL) 300 MG 24 hr tablet, escitalopram (LEXAPRO) 20 MG tablet  Chronic constipation - doing well on meds currently, being used prior to colonoscopy.  To consider continuing daily. Encouraged exercise, high fiber diet   Counseled re: exercise and how to fit it into her routine.  Normal TSH through Dr. Collene Mares earlier this year. No labs needed, check next year (and include D, if not on many fuelings).  Discussed monthly self breast exams and yearly mammograms; at least 30 minutes of aerobic activity at least 5 days/week, weight-bearing exercise at least 2x/week; proper sunscreen use reviewed; healthy diet, including goals of calcium and vitamin D intake and alcohol recommendations (less than or equal to 1 drink/day) reviewed; regular seatbelt use; changing batteries in smoke detectors.  Immunization recommendations discussed--continue yearly flu shots.  Shingrix is recommended (to check insurance  and schedule [place] when convenient).  Update COVID booster when available in October. Colonoscopy recommendations reviewed, scheduled again for later this month. Due for pap and past due for GYN exam, reminded to schedule.  F/u 1 year, sooner prn.

## 2021-02-06 ENCOUNTER — Ambulatory Visit (INDEPENDENT_AMBULATORY_CARE_PROVIDER_SITE_OTHER): Payer: 59 | Admitting: Family Medicine

## 2021-02-06 ENCOUNTER — Other Ambulatory Visit: Payer: Self-pay

## 2021-02-06 ENCOUNTER — Other Ambulatory Visit (HOSPITAL_COMMUNITY): Payer: Self-pay

## 2021-02-06 ENCOUNTER — Encounter: Payer: Self-pay | Admitting: Family Medicine

## 2021-02-06 VITALS — BP 110/64 | HR 72 | Ht 64.0 in | Wt 130.6 lb

## 2021-02-06 DIAGNOSIS — Z0000 Encounter for general adult medical examination without abnormal findings: Secondary | ICD-10-CM

## 2021-02-06 DIAGNOSIS — K5909 Other constipation: Secondary | ICD-10-CM | POA: Diagnosis not present

## 2021-02-06 DIAGNOSIS — F411 Generalized anxiety disorder: Secondary | ICD-10-CM | POA: Diagnosis not present

## 2021-02-06 DIAGNOSIS — F325 Major depressive disorder, single episode, in full remission: Secondary | ICD-10-CM | POA: Diagnosis not present

## 2021-02-06 MED ORDER — BUPROPION HCL ER (XL) 300 MG PO TB24
300.0000 mg | ORAL_TABLET | Freq: Every day | ORAL | 3 refills | Status: DC
  Filled 2021-02-06: qty 90, 90d supply, fill #0
  Filled 2021-05-12: qty 90, 90d supply, fill #1
  Filled 2021-12-15: qty 90, 90d supply, fill #2

## 2021-02-06 MED ORDER — ESCITALOPRAM OXALATE 20 MG PO TABS
20.0000 mg | ORAL_TABLET | Freq: Every day | ORAL | 3 refills | Status: DC
  Filled 2021-02-06: qty 90, 90d supply, fill #0
  Filled 2021-05-31: qty 90, 90d supply, fill #1
  Filled 2021-12-28: qty 90, 90d supply, fill #2

## 2021-02-13 ENCOUNTER — Other Ambulatory Visit (HOSPITAL_COMMUNITY): Payer: Self-pay

## 2021-02-13 ENCOUNTER — Encounter: Payer: Self-pay | Admitting: *Deleted

## 2021-02-13 DIAGNOSIS — D123 Benign neoplasm of transverse colon: Secondary | ICD-10-CM | POA: Diagnosis not present

## 2021-02-13 DIAGNOSIS — R195 Other fecal abnormalities: Secondary | ICD-10-CM | POA: Diagnosis not present

## 2021-02-13 DIAGNOSIS — Z1211 Encounter for screening for malignant neoplasm of colon: Secondary | ICD-10-CM | POA: Diagnosis not present

## 2021-02-13 DIAGNOSIS — D120 Benign neoplasm of cecum: Secondary | ICD-10-CM | POA: Diagnosis not present

## 2021-02-13 DIAGNOSIS — Z1210 Encounter for screening for malignant neoplasm of intestinal tract, unspecified: Secondary | ICD-10-CM | POA: Diagnosis not present

## 2021-02-13 DIAGNOSIS — K635 Polyp of colon: Secondary | ICD-10-CM | POA: Diagnosis not present

## 2021-02-13 LAB — HM COLONOSCOPY

## 2021-02-15 LAB — HM COLONOSCOPY

## 2021-02-17 ENCOUNTER — Other Ambulatory Visit (HOSPITAL_COMMUNITY): Payer: Self-pay

## 2021-02-17 MED ORDER — MOTEGRITY 2 MG PO TABS
2.0000 mg | ORAL_TABLET | Freq: Every day | ORAL | 4 refills | Status: DC
  Filled 2021-02-17: qty 90, 90d supply, fill #0

## 2021-02-24 ENCOUNTER — Other Ambulatory Visit (INDEPENDENT_AMBULATORY_CARE_PROVIDER_SITE_OTHER): Payer: 59

## 2021-02-24 ENCOUNTER — Other Ambulatory Visit: Payer: Self-pay

## 2021-02-24 DIAGNOSIS — Z23 Encounter for immunization: Secondary | ICD-10-CM | POA: Diagnosis not present

## 2021-03-15 ENCOUNTER — Encounter: Payer: Self-pay | Admitting: *Deleted

## 2021-03-27 DIAGNOSIS — Z1389 Encounter for screening for other disorder: Secondary | ICD-10-CM | POA: Diagnosis not present

## 2021-03-27 DIAGNOSIS — Z1151 Encounter for screening for human papillomavirus (HPV): Secondary | ICD-10-CM | POA: Diagnosis not present

## 2021-03-27 DIAGNOSIS — Z6823 Body mass index (BMI) 23.0-23.9, adult: Secondary | ICD-10-CM | POA: Diagnosis not present

## 2021-03-27 DIAGNOSIS — Z124 Encounter for screening for malignant neoplasm of cervix: Secondary | ICD-10-CM | POA: Diagnosis not present

## 2021-03-27 DIAGNOSIS — Z01419 Encounter for gynecological examination (general) (routine) without abnormal findings: Secondary | ICD-10-CM | POA: Diagnosis not present

## 2021-03-27 DIAGNOSIS — F520 Hypoactive sexual desire disorder: Secondary | ICD-10-CM | POA: Diagnosis not present

## 2021-03-27 DIAGNOSIS — N925 Other specified irregular menstruation: Secondary | ICD-10-CM | POA: Diagnosis not present

## 2021-03-27 DIAGNOSIS — Z130 Encounter for screening for diseases of the blood and blood-forming organs and certain disorders involving the immune mechanism: Secondary | ICD-10-CM | POA: Diagnosis not present

## 2021-04-01 LAB — HM PAP SMEAR: HM Pap smear: NEGATIVE

## 2021-04-01 LAB — RESULTS CONSOLE HPV: CHL HPV: POSITIVE

## 2021-04-04 DIAGNOSIS — M5412 Radiculopathy, cervical region: Secondary | ICD-10-CM | POA: Diagnosis not present

## 2021-04-08 DIAGNOSIS — F32A Depression, unspecified: Secondary | ICD-10-CM | POA: Diagnosis not present

## 2021-04-08 DIAGNOSIS — F411 Generalized anxiety disorder: Secondary | ICD-10-CM | POA: Diagnosis not present

## 2021-04-10 ENCOUNTER — Other Ambulatory Visit (HOSPITAL_COMMUNITY): Payer: Self-pay

## 2021-04-10 MED ORDER — ARIPIPRAZOLE 2 MG PO TABS
2.0000 mg | ORAL_TABLET | Freq: Every day | ORAL | 1 refills | Status: DC
  Filled 2021-04-10: qty 30, 30d supply, fill #0

## 2021-04-13 DIAGNOSIS — M5031 Other cervical disc degeneration,  high cervical region: Secondary | ICD-10-CM | POA: Diagnosis not present

## 2021-04-13 DIAGNOSIS — M5412 Radiculopathy, cervical region: Secondary | ICD-10-CM | POA: Diagnosis not present

## 2021-04-13 DIAGNOSIS — M5021 Other cervical disc displacement,  high cervical region: Secondary | ICD-10-CM | POA: Diagnosis not present

## 2021-04-18 DIAGNOSIS — M5412 Radiculopathy, cervical region: Secondary | ICD-10-CM | POA: Diagnosis not present

## 2021-04-25 ENCOUNTER — Encounter: Payer: Self-pay | Admitting: Internal Medicine

## 2021-05-02 DIAGNOSIS — M5412 Radiculopathy, cervical region: Secondary | ICD-10-CM | POA: Diagnosis not present

## 2021-05-02 DIAGNOSIS — G5601 Carpal tunnel syndrome, right upper limb: Secondary | ICD-10-CM | POA: Diagnosis not present

## 2021-05-02 HISTORY — DX: Carpal tunnel syndrome, right upper limb: G56.01

## 2021-05-02 HISTORY — DX: Radiculopathy, cervical region: M54.12

## 2021-05-04 ENCOUNTER — Encounter: Payer: Self-pay | Admitting: *Deleted

## 2021-05-04 DIAGNOSIS — M5412 Radiculopathy, cervical region: Secondary | ICD-10-CM | POA: Diagnosis not present

## 2021-05-05 ENCOUNTER — Other Ambulatory Visit: Payer: Self-pay | Admitting: Neurosurgery

## 2021-05-05 ENCOUNTER — Other Ambulatory Visit: Payer: 59

## 2021-05-06 DIAGNOSIS — F3181 Bipolar II disorder: Secondary | ICD-10-CM | POA: Diagnosis not present

## 2021-05-06 DIAGNOSIS — F5081 Binge eating disorder: Secondary | ICD-10-CM | POA: Diagnosis not present

## 2021-05-08 ENCOUNTER — Other Ambulatory Visit (HOSPITAL_COMMUNITY): Payer: Self-pay

## 2021-05-08 MED ORDER — ARIPIPRAZOLE 5 MG PO TABS
5.0000 mg | ORAL_TABLET | Freq: Every day | ORAL | 1 refills | Status: DC
  Filled 2021-05-08: qty 30, 30d supply, fill #0
  Filled 2021-10-30: qty 30, 30d supply, fill #1

## 2021-05-10 ENCOUNTER — Other Ambulatory Visit: Payer: Self-pay

## 2021-05-10 ENCOUNTER — Other Ambulatory Visit (INDEPENDENT_AMBULATORY_CARE_PROVIDER_SITE_OTHER): Payer: 59

## 2021-05-10 DIAGNOSIS — Z23 Encounter for immunization: Secondary | ICD-10-CM | POA: Diagnosis not present

## 2021-05-12 ENCOUNTER — Other Ambulatory Visit (HOSPITAL_COMMUNITY): Payer: Self-pay

## 2021-05-15 NOTE — Pre-Procedure Instructions (Signed)
Surgical Instructions    Your procedure is scheduled on Friday, December 2nd, 2022.  Report to [facility] Main [facility]tr[facility] "A" at 11:30 A.M., then check in with the Admitting office.  Call this number if you have problems the morning of surgery:  [phone]   If you have any questions prior to your surgery date call [phone]: Open Monday-Friday 8am-4pm    Remember:  Do not eat or drink after midnight the night before your surgery    Take these medicines the morning of surgery with A SIP OF WATER: ARIPiprazole (ABILIFY)  buPROPion (WELLBUTRIN XL) escitalopram (LEXAPRO) Prucalopride Succinate (MOTEGRITY)   As of today, STOP taking any Aspirin (unless otherwise instructed by your surgeon) Aleve, Naproxen, Ibuprofen, Motrin, Advil, Goody's, BC's, all herbal medications, fish oil, and all vitamins.   After your COVID test   You are not required to quarantine however you are required to wear a well-fitting mask when you are out and around people not in your household.  If your mask becomes wet or soiled, replace with a new one.  Wash your hands often with soap and water for 20 seconds or clean your hands with an alcohol-based hand sanitizer that contains at least 60% alcohol.  Do not share personal items.  Notify your provider: if you are in close contact with someone who has COVID  or if you develop a fever of 100.4 or greater, sneezing, cough, sore throat, shortness of breath or body aches.   Do not wear jewelry or makeup Do not wear lotions, powders, perfumes, or deodorant. Do not shave 48 hours prior to surgery.   Do not bring valuables to the hospital. DO Not wear nail polish, gel polish, artificial nails, or any other type of covering on natural nails including finger and toenails. If patients have artificial nails, gel coating, etc. that need to be removed by a nail salon, please have this removed prior to surgery or surgery may need to be canceled/delayed if the  surgeon/ anesthesia feels like the patient is unable to be adequately monitored.             [facility] is not responsible for any belongings or valuables.  Do NOT Smoke (Tobacco/Vaping)  24 hours prior to your procedure  If you use a CPAP at night, you may bring your mask for your overnight stay.   Contacts, glasses, hearing aids, dentures or partials may not be worn into surgery, please bring cases for these belongings   For patients admitted to the hospital, discharge time will be determined by your treatment team.   Patients discharged the day of surgery will not be allowed to drive home, and someone needs to stay with them for 24 hours.  NO VISITORS WILL BE ALLOWED IN PRE-OP WHERE PATIENTS ARE PREPPED FOR SURGERY.  ONLY 1 SUPPORT PERSON MAY BE PRESENT IN THE WAITING ROOM WHILE YOU ARE IN SURGERY.  IF YOU ARE TO BE ADMITTED, ONCE YOU ARE IN YOUR ROOM YOU WILL BE ALLOWED TWO (2) VISITORS. 1 (ONE) VISITOR MAY STAY OVERNIGHT BUT MUST ARRIVE TO THE ROOM BY 8pm.  Minor children may have two parents present. Special consideration for safety and communication needs will be reviewed on a case by case basis.  Special instructions:    Oral Hygiene is also important to reduce your risk of infection.  Remember - BRUSH YOUR TEETH THE MORNING OF SURGERY WITH YOUR REGULAR TOOTHPASTE   [facility]- Preparing For Surgery  Before surgery, you can play  an important role. Because skin is not sterile, your skin needs to be as free of germs as possible. You can reduce the number of germs on your skin by washing with CHG (chlorahexidine gluconate) Soap before surgery.  CHG is an antiseptic cleaner which kills germs and bonds with the skin to continue killing germs even after washing.     Please do not use if you have an allergy to CHG or antibacterial soaps. If your skin becomes reddened/irritated stop using the CHG.  Do not shave (including legs and underarms) for at least 48 hours prior to first CHG  shower. It is OK to shave your face.  Please follow these instructions carefully.     Shower the NIGHT BEFORE SURGERY and the MORNING OF SURGERY with CHG Soap.   If you chose to wash your hair, wash your hair first as usual with your normal shampoo. After you shampoo, rinse your hair and body thoroughly to remove the shampoo.  Then [employer] and genitals (private parts) with your normal soap and rinse thoroughly to remove soap.  After that Use CHG Soap as you would any other liquid soap. You can apply CHG directly to the skin and wash gently with a scrungie or a clean washcloth.   Apply the CHG Soap to your body ONLY FROM THE NECK DOWN.  Do not use on open wounds or open sores. Avoid contact with your eyes, ears, mouth and genitals (private parts). Wash Face and genitals (private parts)  with your normal soap.   Wash thoroughly, paying special attention to the area where your surgery will be performed.  Thoroughly rinse your body with warm water from the neck down.  DO NOT shower/wash with your normal soap after using and rinsing off the CHG Soap.  Pat yourself dry with a CLEAN TOWEL.  Wear CLEAN PAJAMAS to bed the night before surgery  Place CLEAN SHEETS on your bed the night before your surgery  DO NOT SLEEP WITH PETS.   Day of Surgery:  Take a shower with CHG soap. Wear Clean/Comfortable clothing the morning of surgery Do not apply any deodorants/lotions.   Remember to brush your teeth WITH YOUR REGULAR TOOTHPASTE.   Please read over the following fact sheets that you were given.

## 2021-05-16 ENCOUNTER — Encounter (HOSPITAL_COMMUNITY): Payer: Self-pay

## 2021-05-16 ENCOUNTER — Other Ambulatory Visit: Payer: Self-pay

## 2021-05-16 ENCOUNTER — Encounter (HOSPITAL_COMMUNITY)
Admission: RE | Admit: 2021-05-16 | Discharge: 2021-05-16 | Disposition: A | Discharge status: Home / Self Care | Payer: 59 | Source: Ambulatory Visit | Attending: Neurosurgery | Admitting: Neurosurgery

## 2021-05-16 VITALS — BP 105/71 | HR 70 | Temp 98.5°F | Resp 17 | Ht 64.0 in | Wt 136.0 lb

## 2021-05-16 DIAGNOSIS — Z01812 Encounter for preprocedural laboratory examination: Secondary | ICD-10-CM | POA: Diagnosis not present

## 2021-05-16 DIAGNOSIS — Z20822 Contact with and (suspected) exposure to covid-19: Secondary | ICD-10-CM | POA: Insufficient documentation

## 2021-05-16 DIAGNOSIS — Z01818 Encounter for other preprocedural examination: Secondary | ICD-10-CM

## 2021-05-16 HISTORY — DX: Unspecified osteoarthritis, unspecified site: M19.90

## 2021-05-16 LAB — SURGICAL PCR SCREEN
MRSA, PCR: NEGATIVE
Staphylococcus aureus: NEGATIVE

## 2021-05-16 LAB — CBC
HCT: 43.1 % (ref 36.0–46.0)
Hemoglobin: 13.8 g/dL (ref 12.0–15.0)
MCH: 30.9 pg (ref 26.0–34.0)
MCHC: 32 g/dL (ref 30.0–36.0)
MCV: 96.4 fL (ref 80.0–100.0)
Platelets: 235 10*3/uL (ref 150–400)
RBC: 4.47 MIL/uL (ref 3.87–5.11)
RDW: 11.5 % (ref 11.5–15.5)
WBC: 4.4 10*3/uL (ref 4.0–10.5)
nRBC: 0 % (ref 0.0–0.2)

## 2021-05-16 LAB — SARS CORONAVIRUS 2 (TAT 6-24 HRS): SARS Coronavirus 2: NEGATIVE

## 2021-05-16 NOTE — Progress Notes (Signed)
PCP - Rita Ohara, MD Cardiologist - denies  PPM/ICD - denies Device Orders - n/a Rep Notified - n/a  Chest x-ray - n/a EKG - n/a Stress Test - denies ECHO - denies Cardiac Cath - denies  Sleep Study - denies CPAP - n/a  Fasting Blood Sugar - n/a  Blood Thinner Instructions: n/a  Aspirin Instructions: Patient was instructed: As of today, STOP taking any Aspirin (unless otherwise instructed by your surgeon) Aleve, Naproxen, Ibuprofen, Motrin, Advil, Goody's, BC's, all herbal medications, fish oil, and all vitamins.  ERAS Protcol - n/a  COVID TEST- done in PAT on 05/16/2021   Anesthesia review: no  Patient denies shortness of breath, fever, cough and chest pain at PAT appointment   All instructions explained to the patient, with a verbal understanding of the material. Patient agrees to go over the instructions while at home for a better understanding. Patient also instructed to self quarantine after being tested for COVID-19. The opportunity to ask questions was provided.

## 2021-05-19 ENCOUNTER — Ambulatory Visit (HOSPITAL_COMMUNITY): Payer: 59

## 2021-05-19 ENCOUNTER — Other Ambulatory Visit: Payer: Self-pay

## 2021-05-19 ENCOUNTER — Ambulatory Visit (HOSPITAL_COMMUNITY)
Admission: RE | Admit: 2021-05-19 | Discharge: 2021-05-20 | Disposition: A | Discharge status: Home / Self Care | Payer: 59 | Attending: Neurosurgery | Admitting: Neurosurgery

## 2021-05-19 ENCOUNTER — Encounter (HOSPITAL_COMMUNITY)
Admission: RE | Disposition: A | Discharge status: Home / Self Care | Payer: Self-pay | Source: Home / Self Care | Attending: Neurosurgery

## 2021-05-19 ENCOUNTER — Ambulatory Visit (HOSPITAL_COMMUNITY): Payer: 59 | Admitting: Certified Registered"

## 2021-05-19 ENCOUNTER — Encounter (HOSPITAL_COMMUNITY): Payer: Self-pay | Admitting: Neurosurgery

## 2021-05-19 DIAGNOSIS — F32A Depression, unspecified: Secondary | ICD-10-CM | POA: Diagnosis not present

## 2021-05-19 DIAGNOSIS — M4722 Other spondylosis with radiculopathy, cervical region: Secondary | ICD-10-CM | POA: Insufficient documentation

## 2021-05-19 DIAGNOSIS — F419 Anxiety disorder, unspecified: Secondary | ICD-10-CM | POA: Insufficient documentation

## 2021-05-19 DIAGNOSIS — M4322 Fusion of spine, cervical region: Secondary | ICD-10-CM | POA: Diagnosis not present

## 2021-05-19 DIAGNOSIS — Z87891 Personal history of nicotine dependence: Secondary | ICD-10-CM | POA: Insufficient documentation

## 2021-05-19 DIAGNOSIS — G709 Myoneural disorder, unspecified: Secondary | ICD-10-CM | POA: Diagnosis not present

## 2021-05-19 DIAGNOSIS — M4802 Spinal stenosis, cervical region: Secondary | ICD-10-CM | POA: Insufficient documentation

## 2021-05-19 DIAGNOSIS — Z981 Arthrodesis status: Secondary | ICD-10-CM | POA: Diagnosis not present

## 2021-05-19 DIAGNOSIS — M47812 Spondylosis without myelopathy or radiculopathy, cervical region: Secondary | ICD-10-CM | POA: Diagnosis present

## 2021-05-19 DIAGNOSIS — M4712 Other spondylosis with myelopathy, cervical region: Secondary | ICD-10-CM | POA: Diagnosis not present

## 2021-05-19 DIAGNOSIS — Z419 Encounter for procedure for purposes other than remedying health state, unspecified: Secondary | ICD-10-CM

## 2021-05-19 HISTORY — PX: ANTERIOR CERVICAL DECOMP/DISCECTOMY FUSION: SHX1161

## 2021-05-19 LAB — POCT PREGNANCY, URINE: Preg Test, Ur: NEGATIVE

## 2021-05-19 SURGERY — ANTERIOR CERVICAL DECOMPRESSION/DISCECTOMY FUSION 1 LEVEL
Anesthesia: General

## 2021-05-19 MED ORDER — SUGAMMADEX SODIUM 200 MG/2ML IV SOLN
INTRAVENOUS | Status: DC | PRN
  Administered 2021-05-19: 200 mg via INTRAVENOUS

## 2021-05-19 MED ORDER — BUPROPION HCL ER (XL) 300 MG PO TB24
300.0000 mg | ORAL_TABLET | Freq: Every day | ORAL | Status: DC
  Filled 2021-05-19: qty 1

## 2021-05-19 MED ORDER — VITAMIN D 25 MCG (1000 UNIT) PO TABS
1000.0000 [IU] | ORAL_TABLET | Freq: Every day | ORAL | Status: DC
  Administered 2021-05-19: 1000 [IU] via ORAL
  Filled 2021-05-19: qty 1

## 2021-05-19 MED ORDER — EPHEDRINE SULFATE-NACL 50-0.9 MG/10ML-% IV SOSY
PREFILLED_SYRINGE | INTRAVENOUS | Status: DC | PRN
  Administered 2021-05-19: 5 mg via INTRAVENOUS
  Administered 2021-05-19: 10 mg via INTRAVENOUS

## 2021-05-19 MED ORDER — 0.9 % SODIUM CHLORIDE (POUR BTL) OPTIME
TOPICAL | Status: DC | PRN
  Administered 2021-05-19: 1000 mL

## 2021-05-19 MED ORDER — CEFAZOLIN SODIUM-DEXTROSE 2-3 GM-%(50ML) IV SOLR
INTRAVENOUS | Status: DC | PRN
  Administered 2021-05-19: 2 g via INTRAVENOUS

## 2021-05-19 MED ORDER — ESCITALOPRAM OXALATE 20 MG PO TABS
20.0000 mg | ORAL_TABLET | Freq: Every day | ORAL | Status: DC
  Administered 2021-05-19: 20 mg via ORAL
  Filled 2021-05-19 (×2): qty 1

## 2021-05-19 MED ORDER — CYCLOBENZAPRINE HCL 10 MG PO TABS: 10.0000 mg | ORAL_TABLET | Freq: Three times a day (TID) | ORAL | Status: DC | PRN

## 2021-05-19 MED ORDER — FENTANYL CITRATE (PF) 250 MCG/5ML IJ SOLN
INTRAMUSCULAR | Status: DC | PRN
  Administered 2021-05-19 (×2): 50 ug via INTRAVENOUS

## 2021-05-19 MED ORDER — LACTATED RINGERS IV SOLN: INTRAVENOUS | Status: DC | PRN

## 2021-05-19 MED ORDER — DEXAMETHASONE SODIUM PHOSPHATE 10 MG/ML IJ SOLN: 10.0000 mg | Freq: Once | INTRAMUSCULAR | Status: DC

## 2021-05-19 MED ORDER — CHLORHEXIDINE GLUCONATE CLOTH 2 % EX PADS: 6.0000 | MEDICATED_PAD | Freq: Once | CUTANEOUS | Status: DC

## 2021-05-19 MED ORDER — PRUCALOPRIDE SUCCINATE 2 MG PO TABS: 2.0000 mg | ORAL_TABLET | Freq: Every day | ORAL | Status: DC

## 2021-05-19 MED ORDER — DEXAMETHASONE SODIUM PHOSPHATE 10 MG/ML IJ SOLN
INTRAMUSCULAR | Status: DC | PRN
  Administered 2021-05-19: 10 mg via INTRAVENOUS

## 2021-05-19 MED ORDER — ARIPIPRAZOLE 5 MG PO TABS
5.0000 mg | ORAL_TABLET | Freq: Every day | ORAL | Status: DC
  Administered 2021-05-19: 5 mg via ORAL
  Filled 2021-05-19 (×2): qty 1

## 2021-05-19 MED ORDER — ACETAMINOPHEN 10 MG/ML IV SOLN
INTRAVENOUS | Status: DC | PRN
  Administered 2021-05-19: 1000 mg via INTRAVENOUS

## 2021-05-19 MED ORDER — ALUM & MAG HYDROXIDE-SIMETH 200-200-20 MG/5ML PO SUSP: 30.0000 mL | Freq: Four times a day (QID) | ORAL | Status: DC | PRN

## 2021-05-19 MED ORDER — PROMETHAZINE HCL 25 MG/ML IJ SOLN: 6.2500 mg | INTRAMUSCULAR | Status: DC | PRN

## 2021-05-19 MED ORDER — THROMBIN 5000 UNITS EX SOLR: OROMUCOSAL | Status: DC | PRN

## 2021-05-19 MED ORDER — CHLORHEXIDINE GLUCONATE 0.12 % MT SOLN
15.0000 mL | Freq: Once | OROMUCOSAL | Status: AC
  Administered 2021-05-19: 15 mL via OROMUCOSAL
  Filled 2021-05-19: qty 15

## 2021-05-19 MED ORDER — MIDAZOLAM HCL 2 MG/2ML IJ SOLN
INTRAMUSCULAR | Status: AC
  Filled 2021-05-19: qty 2

## 2021-05-19 MED ORDER — PANTOPRAZOLE SODIUM 40 MG IV SOLR
40.0000 mg | Freq: Every day | INTRAVENOUS | Status: DC
  Administered 2021-05-19: 40 mg via INTRAVENOUS
  Filled 2021-05-19: qty 40

## 2021-05-19 MED ORDER — ACETAMINOPHEN 650 MG RE SUPP: 650.0000 mg | RECTAL | Status: DC | PRN

## 2021-05-19 MED ORDER — CEFAZOLIN SODIUM-DEXTROSE 2-4 GM/100ML-% IV SOLN
2.0000 g | Freq: Three times a day (TID) | INTRAVENOUS | Status: AC
  Administered 2021-05-19 – 2021-05-20 (×2): 2 g via INTRAVENOUS
  Filled 2021-05-19 (×2): qty 100

## 2021-05-19 MED ORDER — ACETAMINOPHEN 10 MG/ML IV SOLN
INTRAVENOUS | Status: AC
  Filled 2021-05-19: qty 100

## 2021-05-19 MED ORDER — ROCURONIUM BROMIDE 10 MG/ML (PF) SYRINGE
PREFILLED_SYRINGE | INTRAVENOUS | Status: DC | PRN
  Administered 2021-05-19: 20 mg via INTRAVENOUS
  Administered 2021-05-19: 50 mg via INTRAVENOUS

## 2021-05-19 MED ORDER — ARIPIPRAZOLE 2 MG PO TABS
2.0000 mg | ORAL_TABLET | Freq: Every day | ORAL | Status: DC
  Filled 2021-05-19: qty 1

## 2021-05-19 MED ORDER — ACETAMINOPHEN 325 MG PO TABS: 650.0000 mg | ORAL_TABLET | ORAL | Status: DC | PRN

## 2021-05-19 MED ORDER — LIDOCAINE 2% (20 MG/ML) 5 ML SYRINGE
INTRAMUSCULAR | Status: DC | PRN
  Administered 2021-05-19: 60 mg via INTRAVENOUS

## 2021-05-19 MED ORDER — SODIUM CHLORIDE 0.9 % IV SOLN
0.0125 ug/kg/min | INTRAVENOUS | Status: AC
  Administered 2021-05-19: .1 ug/kg/min via INTRAVENOUS
  Filled 2021-05-19: qty 2000

## 2021-05-19 MED ORDER — FENTANYL CITRATE (PF) 250 MCG/5ML IJ SOLN
INTRAMUSCULAR | Status: AC
  Filled 2021-05-19: qty 5

## 2021-05-19 MED ORDER — VANCOMYCIN HCL IN DEXTROSE 1-5 GM/200ML-% IV SOLN
1000.0000 mg | INTRAVENOUS | Status: AC
  Administered 2021-05-19: 1000 mg via INTRAVENOUS
  Filled 2021-05-19: qty 200

## 2021-05-19 MED ORDER — HYDROMORPHONE HCL 1 MG/ML IJ SOLN: 0.5000 mg | INTRAMUSCULAR | Status: DC | PRN

## 2021-05-19 MED ORDER — ORAL CARE MOUTH RINSE: 15.0000 mL | Freq: Once | OROMUCOSAL | Status: AC

## 2021-05-19 MED ORDER — SODIUM CHLORIDE 0.9% FLUSH
3.0000 mL | Freq: Two times a day (BID) | INTRAVENOUS | Status: DC
  Administered 2021-05-19: 3 mL via INTRAVENOUS

## 2021-05-19 MED ORDER — ONDANSETRON HCL 4 MG/2ML IJ SOLN: 4.0000 mg | Freq: Four times a day (QID) | INTRAMUSCULAR | Status: DC | PRN

## 2021-05-19 MED ORDER — PROPOFOL 10 MG/ML IV BOLUS
INTRAVENOUS | Status: DC | PRN
  Administered 2021-05-19: 100 mg via INTRAVENOUS

## 2021-05-19 MED ORDER — OXYCODONE HCL 5 MG PO TABS
10.0000 mg | ORAL_TABLET | ORAL | Status: DC | PRN
  Administered 2021-05-19 – 2021-05-20 (×4): 10 mg via ORAL
  Filled 2021-05-19 (×4): qty 2

## 2021-05-19 MED ORDER — THROMBIN 5000 UNITS EX SOLR
CUTANEOUS | Status: DC | PRN
  Administered 2021-05-19 (×2): 5000 [IU] via TOPICAL

## 2021-05-19 MED ORDER — MIDAZOLAM HCL 2 MG/2ML IJ SOLN
INTRAMUSCULAR | Status: DC | PRN
  Administered 2021-05-19: 2 mg via INTRAVENOUS

## 2021-05-19 MED ORDER — ONDANSETRON HCL 4 MG/2ML IJ SOLN
INTRAMUSCULAR | Status: DC | PRN
  Administered 2021-05-19: 4 mg via INTRAVENOUS

## 2021-05-19 MED ORDER — PHENYLEPHRINE HCL-NACL 20-0.9 MG/250ML-% IV SOLN
INTRAVENOUS | Status: DC | PRN
  Administered 2021-05-19: 20 ug/min via INTRAVENOUS

## 2021-05-19 MED ORDER — PHENOL 1.4 % MT LIQD: 1.0000 | OROMUCOSAL | Status: DC | PRN

## 2021-05-19 MED ORDER — LACTATED RINGERS IV SOLN: INTRAVENOUS | Status: DC

## 2021-05-19 MED ORDER — PHENYLEPHRINE 40 MCG/ML (10ML) SYRINGE FOR IV PUSH (FOR BLOOD PRESSURE SUPPORT)
PREFILLED_SYRINGE | INTRAVENOUS | Status: DC | PRN
  Administered 2021-05-19 (×4): 80 ug via INTRAVENOUS

## 2021-05-19 MED ORDER — SODIUM CHLORIDE 0.9% FLUSH: 3.0000 mL | INTRAVENOUS | Status: DC | PRN

## 2021-05-19 MED ORDER — HEMOSTATIC AGENTS (NO CHARGE) OPTIME
TOPICAL | Status: DC | PRN
  Administered 2021-05-19: 1 via TOPICAL

## 2021-05-19 MED ORDER — PROPOFOL 10 MG/ML IV BOLUS
INTRAVENOUS | Status: AC
  Filled 2021-05-19: qty 20

## 2021-05-19 MED ORDER — SODIUM CHLORIDE 0.9 % IV SOLN: 250.0000 mL | INTRAVENOUS | Status: DC

## 2021-05-19 MED ORDER — ONDANSETRON HCL 4 MG PO TABS: 4.0000 mg | ORAL_TABLET | Freq: Four times a day (QID) | ORAL | Status: DC | PRN

## 2021-05-19 MED ORDER — THROMBIN 5000 UNITS EX SOLR
CUTANEOUS | Status: AC
  Filled 2021-05-19: qty 15000

## 2021-05-19 MED ORDER — FENTANYL CITRATE (PF) 100 MCG/2ML IJ SOLN: 25.0000 ug | INTRAMUSCULAR | Status: DC | PRN

## 2021-05-19 MED ORDER — MENTHOL 3 MG MT LOZG: 1.0000 | LOZENGE | OROMUCOSAL | Status: DC | PRN

## 2021-05-19 SURGICAL SUPPLY — 58 items
BAG COUNTER SPONGE SURGICOUNT (BAG) ×4 IMPLANT
BAG SURGICOUNT SPONGE COUNTING (BAG) ×2
BAND RUBBER #18 3X1/16 STRL (MISCELLANEOUS) ×6 IMPLANT
BASKET BONE COLLECTION (BASKET) ×3 IMPLANT
BENZOIN TINCTURE PRP APPL 2/3 (GAUZE/BANDAGES/DRESSINGS) ×3 IMPLANT
BIT DRILL NEURO 2X3.1 SFT TUCH (MISCELLANEOUS) ×1 IMPLANT
BUR MATCHSTICK NEURO 3.0 LAGG (BURR) ×3 IMPLANT
CANISTER SUCT 3000ML PPV (MISCELLANEOUS) ×3 IMPLANT
CARTRIDGE OIL MAESTRO DRILL (MISCELLANEOUS) ×1 IMPLANT
CLOSURE WOUND 1/2 X4 (GAUZE/BANDAGES/DRESSINGS) ×1
DERMABOND ADVANCED (GAUZE/BANDAGES/DRESSINGS) ×2
DERMABOND ADVANCED .7 DNX12 (GAUZE/BANDAGES/DRESSINGS) ×1 IMPLANT
DIFFUSER DRILL AIR PNEUMATIC (MISCELLANEOUS) ×3 IMPLANT
DRAPE C-ARM 42X72 X-RAY (DRAPES) ×6 IMPLANT
DRAPE LAPAROTOMY 100X72 PEDS (DRAPES) ×3 IMPLANT
DRAPE MICROSCOPE LEICA (MISCELLANEOUS) ×3 IMPLANT
DRILL NEURO 2X3.1 SOFT TOUCH (MISCELLANEOUS) ×3
DRSG OPSITE POSTOP 4X6 (GAUZE/BANDAGES/DRESSINGS) ×3 IMPLANT
DURAPREP 6ML APPLICATOR 50/CS (WOUND CARE) ×3 IMPLANT
ELECT COATED BLADE 2.86 ST (ELECTRODE) ×3 IMPLANT
ELECT REM PT RETURN 9FT ADLT (ELECTROSURGICAL) ×3
ELECTRODE REM PT RTRN 9FT ADLT (ELECTROSURGICAL) ×1 IMPLANT
GAUZE 4X4 16PLY ~~LOC~~+RFID DBL (SPONGE) IMPLANT
GAUZE SPONGE 4X4 12PLY STRL (GAUZE/BANDAGES/DRESSINGS) ×3 IMPLANT
GLOVE EXAM NITRILE XL STR (GLOVE) IMPLANT
GLOVE SURG ENC MOIS LTX SZ7 (GLOVE) IMPLANT
GLOVE SURG ENC MOIS LTX SZ8 (GLOVE) ×3 IMPLANT
GLOVE SURG UNDER LTX SZ8.5 (GLOVE) ×3 IMPLANT
GLOVE SURG UNDER POLY LF SZ7 (GLOVE) IMPLANT
GOWN STRL REUS W/ TWL LRG LVL3 (GOWN DISPOSABLE) ×1 IMPLANT
GOWN STRL REUS W/ TWL XL LVL3 (GOWN DISPOSABLE) ×1 IMPLANT
GOWN STRL REUS W/TWL 2XL LVL3 (GOWN DISPOSABLE) ×3 IMPLANT
GOWN STRL REUS W/TWL LRG LVL3 (GOWN DISPOSABLE) ×3
GOWN STRL REUS W/TWL XL LVL3 (GOWN DISPOSABLE) ×3
HALTER HD/CHIN CERV TRACTION D (MISCELLANEOUS) ×3 IMPLANT
HEMOSTAT POWDER KIT SURGIFOAM (HEMOSTASIS) ×3 IMPLANT
KIT BASIN OR (CUSTOM PROCEDURE TRAY) ×3 IMPLANT
KIT TURNOVER KIT B (KITS) ×3 IMPLANT
NEEDLE HYPO 18GX1.5 BLUNT FILL (NEEDLE) ×3 IMPLANT
NEEDLE SPNL 20GX3.5 QUINCKE YW (NEEDLE) ×3 IMPLANT
NS IRRIG 1000ML POUR BTL (IV SOLUTION) ×3 IMPLANT
OIL CARTRIDGE MAESTRO DRILL (MISCELLANEOUS) ×3
PACK LAMINECTOMY NEURO (CUSTOM PROCEDURE TRAY) ×3 IMPLANT
PAD ARMBOARD 7.5X6 YLW CONV (MISCELLANEOUS) ×9 IMPLANT
PIN DISTRACTION 14MM (PIN) IMPLANT
PLATE ANT CERV XTEND 1 LV 14 (Plate) ×3 IMPLANT
PUTTY BONE DBX 2.5 MIS (Bone Implant) ×3 IMPLANT
SCREW VAR 4.2 XD SELF DRILL 14 (Screw) ×12 IMPLANT
SPACER C HEDRON 12X14 7M 7D (Spacer) ×3 IMPLANT
SPONGE INTESTINAL PEANUT (DISPOSABLE) ×6 IMPLANT
SPONGE SURGIFOAM ABS GEL SZ50 (HEMOSTASIS) ×3 IMPLANT
STRIP CLOSURE SKIN 1/2X4 (GAUZE/BANDAGES/DRESSINGS) ×2 IMPLANT
SUT VIC AB 3-0 SH 8-18 (SUTURE) ×3 IMPLANT
SUT VICRYL 4-0 PS2 18IN ABS (SUTURE) ×3 IMPLANT
TAPE CLOTH 4X10 WHT NS (GAUZE/BANDAGES/DRESSINGS) IMPLANT
TOWEL GREEN STERILE (TOWEL DISPOSABLE) ×3 IMPLANT
TOWEL GREEN STERILE FF (TOWEL DISPOSABLE) ×3 IMPLANT
WATER STERILE IRR 1000ML POUR (IV SOLUTION) ×3 IMPLANT

## 2021-05-19 NOTE — Transfer of Care (Signed)
Immediate Anesthesia Transfer of Care Note  Patient: Sheila Obrien  Procedure(s) Performed: cervical four-cervical five anterior cervical decompression and fusion  Patient Location: PACU  Anesthesia Type:General  Level of Consciousness: awake, alert  and oriented  Airway & Oxygen Therapy: Patient Spontanous Breathing  Post-op Assessment: Report given to RN and Post -op Vital signs reviewed and stable  Post vital signs: Reviewed and stable  Last Vitals:  Vitals Value Taken Time  BP 131/47 05/19/21 1713  Temp    Pulse 106 05/19/21 1715  Resp 13 05/19/21 1715  SpO2 99 % 05/19/21 1715  Vitals shown include unvalidated device data.  Last Pain:  Vitals:   05/19/21 1351  PainSc: 0-No pain         Complications: No notable events documented.

## 2021-05-19 NOTE — Anesthesia Preprocedure Evaluation (Addendum)
Anesthesia Evaluation  Patient identified by MRN, date of birth, ID band Patient awake    Reviewed: Allergy & Precautions, NPO status , Patient's Chart, lab work & pertinent test results  Airway Mallampati: I  TM Distance: >3 FB Neck ROM: Limited    Dental  (+) Teeth Intact, Dental Advisory Given   Pulmonary neg pulmonary ROS, former smoker,    Pulmonary exam normal breath sounds clear to auscultation       Cardiovascular negative cardio ROS Normal cardiovascular exam Rhythm:Regular Rate:Normal     Neuro/Psych PSYCHIATRIC DISORDERS Anxiety Depression  Neuromuscular disease    GI/Hepatic negative GI ROS, Neg liver ROS,   Endo/Other  negative endocrine ROS  Renal/GU negative Renal ROS     Musculoskeletal  (+) Arthritis ,   Abdominal   Peds  Hematology negative hematology ROS (+)   Anesthesia Other Findings Day of surgery medications reviewed with the patient.  Reproductive/Obstetrics                            Anesthesia Physical Anesthesia Plan  ASA: 2  Anesthesia Plan: General   Post-op Pain Management:    Induction: Intravenous  PONV Risk Score and Plan: 3 and Midazolam, Dexamethasone and Ondansetron  Airway Management Planned: Oral ETT and Video Laryngoscope Planned  Additional Equipment:   Intra-op Plan:   Post-operative Plan: Extubation in OR  Informed Consent: I have reviewed the patients History and Physical, chart, labs and discussed the procedure including the risks, benefits and alternatives for the proposed anesthesia with the patient or authorized representative who has indicated his/her understanding and acceptance.     Dental advisory given  Plan Discussed with: CRNA  Anesthesia Plan Comments:         Anesthesia Quick Evaluation

## 2021-05-19 NOTE — H&P (Signed)
Sheila Obrien is an 50 y.o. female.   Chief Complaint: Neck right shoulder and arm pain HPI: 49 year old female with neck pain right shoulder and arm pain weakness and atrophy work-up is revealed severe stenosis at C4-5 with severe foraminal stenosis of the right C5 nerve root.  Patient was also worked up with EMG nerve conduction test which was unremarkable.  Due to patient's progression of clinical syndrome imaging findings and failed conservative treatment we recommended anterior cervical discectomy and fusion at C4-5 we extensively reviewed the risks and benefits of the operation with the patient as well as perioperative course expectations of outcome and alternatives to surgery and she understands and agrees to proceed forward.  Past Medical History:  Diagnosis Date   Anxiety    Arthritis    Cervical radiculopathy 05/02/2021   Depression    Eczema    Low back pain    intermittent problems with SI joint   Right carpal tunnel syndrome 05/02/2021   Sensorineural hearing loss    left ear, since childhood   Ureteral reflux    s/p surgery    Past Surgical History:  Procedure Laterality Date   bilateral ureteral reimplantation  05/1990   Dr.Humphries   CESAREAN SECTION  2004    Family History  Problem Relation Age of Onset   Alcohol abuse Father    Hypertension Father    Depression Father    Glaucoma Father    Cancer Mother 73       vaginal   Migraines Sister    Migraines Son        better after puberty   Cancer Other        colon cancer (older when diagnosed)   Diabetes Neg Hx    Heart disease Neg Hx    Social History:  reports that she quit smoking about 24 years ago. Her smoking use included cigarettes. She has never used smokeless tobacco. She reports current alcohol use. She reports that she does not use drugs.  Allergies:  Allergies  Allergen Reactions   Penicillins Hives    Medications Prior to Admission  Medication Sig Dispense Refill   ARIPiprazole  (ABILIFY) 5 MG tablet Take 1 tablet (5 mg total) by mouth daily. 30 tablet 1   buPROPion (WELLBUTRIN XL) 300 MG 24 hr tablet Take 1 tablet (300 mg total) by mouth daily. 90 tablet 3   cholecalciferol (VITAMIN D3) 25 MCG (1000 UNIT) tablet Take 1,000 Units by mouth daily.     escitalopram (LEXAPRO) 20 MG tablet Take 1 tablet (20 mg total) by mouth daily. 90 tablet 3   ARIPiprazole (ABILIFY) 2 MG tablet Take 1 tablet (2 mg total) by mouth daily. (Patient not taking: Reported on 05/19/2021) 30 tablet 1   Prucalopride Succinate (MOTEGRITY) 2 MG TABS Take 1 tablet (2 mg total) by mouth daily. (Patient not taking: Reported on 05/19/2021) 90 tablet 4    Results for orders placed or performed during the hospital encounter of 05/19/21 (from the past 48 hour(s))  Pregnancy, urine POC     Status: None   Collection Time: 05/19/21  1:45 PM  Result Value Ref Range   Preg Test, Ur NEGATIVE NEGATIVE    Comment:        THE SENSITIVITY OF THIS METHODOLOGY IS >24 mIU/mL    No results found.  Review of Systems  Musculoskeletal:  Positive for neck pain.  Neurological:  Positive for weakness and numbness.   Blood pressure 121/65, pulse 74, temperature 97.7  F (36.5 C), resp. rate 17, height 5\' 4"  (1.626 m), weight 59.9 kg, last menstrual period 04/22/2021, SpO2 98 %. Physical Exam HENT:     Head: Normocephalic.     Nose: Nose normal.     Mouth/Throat:     Mouth: Mucous membranes are moist.  Eyes:     Pupils: Pupils are equal, round, and reactive to light.  Cardiovascular:     Rate and Rhythm: Normal rate.  Pulmonary:     Effort: Pulmonary effort is normal.  Abdominal:     General: Abdomen is flat.  Musculoskeletal:        General: Normal range of motion.  Neurological:     General: No focal deficit present.     Mental Status: She is alert.     Comments: 4-5 weakness right bicep and deltoid otherwise 5 out of 5     Assessment/Plan 50 year old female presents for ACDF C4-5  Elaina Hoops,  MD 05/19/2021, 2:29 PM

## 2021-05-19 NOTE — Op Note (Signed)
Preoperative diagnosis: Cervical spondylitic radiculopathy from severe cervical stenosis C4-5 with a C5 radiculopathy on the right  Postoperative diagnosis: Same  Procedure: Anterior cervical discectomy and fusion utilizing globus titanium cage packed with locally harvested autograft mixed with DBX mix and globus extend plating system  Surgeon: Dominica Severin Kelcy Laible  Assistant: Nash Shearer  Anesthesia: General  EBL: Minimal  HPI: 50 year old female progressive worsening neck pain right shoulder and arm pain weakness in her right deltoid and right biceps and some muscle atrophy work-up revealed severe compression at C4-5 on the right C5 nerve root with a large spondylitic spur this was above a 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  Due to patient's progression of clinical syndrome failed conservative treatment imaging I recommended anterior cervical discectomy and fusion at that level.  I extensively reviewed the risks and benefits of the operation with her as well as perioperative course expectations of outcome and alternatives of surgery and she understood and agreed to proceed forward.  Operative procedure: Patient was brought into the OR was [facility] general anesthesia positioned spine and neck in slight extension and 5 pounds halter traction.  The right 7 neck was prepped and draped in routine sterile fashion.  Preoperative x-ray localized the appropriate level.  Curvilinear incision was made just off the midline to the endpoint of the sternocleidomastoid and superficial abscess was dissected out divided longitudinally.  The avascular plane between the sternocleidomastoid and strap muscle was developed down to the prevertebral fascia and prevertebral fascia was dissected away with Kitners.  Intraoperative x-ray confirmed defecation appropriate level there was a large anterior spur this was Bitton away with a Leksell rongeur disc base was then drilled down capturing the bone shavings and mucus trap.  Under microscopic  lamination further drilling of the posterior osteophytic complexes identification of posterior logical ligament which was removed in piecemeal fashion exposing the thecal sac very large rightward spondylitic spur with excessive amount of motion instability was immediately identified.  All this was aggressively under Bitton marching laterally both C5 pedicles were identified and both C5 nerve roots were decompressed and skeletonized flush with the pedicle.  At the end of discectomy there is no further stenosis either centrally or foraminally medical hemostasis was maintained I selected a 7 mm lordotic titanium cage packed with locally harvested autograft mixed with DBX mix inserted 2 mm deep to the intervertebral line packed some additional bone graft laterally and anterior to the cage then placed a 14 mm globus extend plate with 010 mm screws.  All screws excellent purchase locking mechanism was engaged postop fluoroscopy confirmed good position of all the implants.  Wounds are copiously irrigated to Kassim states was maintained the platysma was reapproximated with interrupted Vicryl skin was closed running 4 subcuticular Dermabond benzoin Steri-Strips and a sterile dressing was applied patient recovery in stable condition.  At the end the case all needle counts and sponge counts were correct.

## 2021-05-19 NOTE — Anesthesia Procedure Notes (Signed)
Procedure Name: Intubation Date/Time: 05/19/2021 3:12 PM Performed by: Dorthea Cove, CRNA Pre-anesthesia Checklist: Patient identified, Emergency Drugs available, Suction available and Patient being monitored Patient Re-evaluated:Patient Re-evaluated prior to induction Oxygen Delivery Method: Circle system utilized Preoxygenation: Pre-oxygenation with 100% oxygen Induction Type: IV induction Ventilation: Mask ventilation without difficulty Laryngoscope Size: Mac, Glidescope and 3 Grade View: Grade I Tube type: Oral Tube size: 7.0 mm Number of attempts: 1 Airway Equipment and Method: Stylet and Oral airway Placement Confirmation: ETT inserted through vocal cords under direct vision, positive ETCO2 and breath sounds checked- equal and bilateral Secured at: 21 cm Tube secured with: Tape Dental Injury: Teeth and Oropharynx as per pre-operative assessment

## 2021-05-20 DIAGNOSIS — Z87891 Personal history of nicotine dependence: Secondary | ICD-10-CM | POA: Diagnosis not present

## 2021-05-20 DIAGNOSIS — M4722 Other spondylosis with radiculopathy, cervical region: Secondary | ICD-10-CM | POA: Diagnosis not present

## 2021-05-20 DIAGNOSIS — F419 Anxiety disorder, unspecified: Secondary | ICD-10-CM | POA: Diagnosis not present

## 2021-05-20 DIAGNOSIS — F32A Depression, unspecified: Secondary | ICD-10-CM | POA: Diagnosis not present

## 2021-05-20 DIAGNOSIS — G709 Myoneural disorder, unspecified: Secondary | ICD-10-CM | POA: Diagnosis not present

## 2021-05-20 DIAGNOSIS — M4802 Spinal stenosis, cervical region: Secondary | ICD-10-CM | POA: Diagnosis not present

## 2021-05-20 MED ORDER — OXYCODONE-ACETAMINOPHEN 5-325 MG PO TABS: 1.0000 | ORAL_TABLET | ORAL | 0 refills | Status: DC | PRN

## 2021-05-20 MED ORDER — CYCLOBENZAPRINE HCL 10 MG PO TABS
10.0000 mg | ORAL_TABLET | Freq: Three times a day (TID) | ORAL | 0 refills | Status: DC | PRN
Start: 2021-05-20 — End: 2022-02-15

## 2021-05-20 MED ORDER — OXYCODONE-ACETAMINOPHEN 5-325 MG PO TABS
1.0000 | ORAL_TABLET | ORAL | Status: DC | PRN
Start: 2021-05-20 — End: 2021-05-20

## 2021-05-20 NOTE — Discharge Summary (Signed)
Physician Discharge Summary  Patient ID: Sheila Obrien MRN: [id number] DOB/AGE: 1971-04-01 50 y.o.  Admit date: 05/19/2021 Discharge date: 05/20/2021  Admission Diagnoses: Cervical spondylosis  Discharge Diagnoses: The same Principal Problem:   Spondylosis of cervical joint   Discharged Condition: good  Hospital Course: Dr. Saintclair Halsted performed an anterior cervical discectomy fusion and plating on the patient on 05/19/2021.  The patient's postoperative course was unremarkable.  On postoperative day #1 she requested discharge to home.  She was given verbal and written discharge instructions.  All her questions were answered.  Consults: PT, OT, care management Significant Diagnostic Studies: None Treatments: Anterior cervicectomy fusion and plating. Discharge Exam: Blood pressure (!) 94/53, pulse 81, temperature 98.5 F (36.9 C), temperature source Oral, resp. rate 18, height 5\' 4"  (1.626 m), weight 59.9 kg, last menstrual period 04/22/2021, SpO2 99 %. The patient is alert and pleasant.  She looks well.  Her dressing is clean and dry.  Her strength is normal.  Disposition: Home  Discharge Instructions     Call MD for:  difficulty breathing, headache or visual disturbances   Complete by: As directed    Call MD for:  extreme fatigue   Complete by: As directed    Call MD for:  hives   Complete by: As directed    Call MD for:  persistant dizziness or light-headedness   Complete by: As directed    Call MD for:  persistant nausea and vomiting   Complete by: As directed    Call MD for:  redness, tenderness, or signs of infection (pain, swelling, redness, odor or green/yellow discharge around incision site)   Complete by: As directed    Call MD for:  severe uncontrolled pain   Complete by: As directed    Call MD for:  temperature >100.4   Complete by: As directed    Diet - low sodium heart healthy   Complete by: As directed    Discharge instructions   Complete by: As directed     Call [phone] for a followup appointment. Take a stool softener while you are using pain medications.   Driving Restrictions   Complete by: As directed    Do not drive for 2 weeks.   Increase activity slowly   Complete by: As directed    Lifting restrictions   Complete by: As directed    Do not lift more than 5 pounds. No excessive bending or twisting.   May shower / Bathe   Complete by: As directed    Remove the dressing for 3 days after surgery.  You may shower, but leave the incision alone.   Remove dressing in 48 hours   Complete by: As directed       Allergies as of 05/20/2021       Reactions   Penicillins Hives        Medication List     STOP taking these medications    Motegrity 2 MG Tabs Generic drug: Prucalopride Succinate       TAKE these medications    ARIPiprazole 5 MG tablet Commonly known as: ABILIFY Take 1 tablet (5 mg total) by mouth daily. What changed: Another medication with the same name was removed. Continue taking this medication, and follow the directions you see here.   buPROPion 300 MG 24 hr tablet Commonly known as: WELLBUTRIN XL Take 1 tablet (300 mg total) by mouth daily.   cholecalciferol 25 MCG (1000 UNIT) tablet Commonly known as: VITAMIN D3 Take 1,000 Units  by mouth daily.   cyclobenzaprine 10 MG tablet Commonly known as: FLEXERIL Take 1 tablet (10 mg total) by mouth 3 (three) times daily as needed for muscle spasms.   escitalopram 20 MG tablet Commonly known as: LEXAPRO Take 1 tablet (20 mg total) by mouth daily.   oxyCODONE-acetaminophen 5-325 MG tablet Commonly known as: PERCOCET/ROXICET Take 1-2 tablets by mouth every 4 (four) hours as needed for moderate pain.         Signed: Ophelia Charter 05/20/2021, 9:05 AM

## 2021-05-20 NOTE — Anesthesia Postprocedure Evaluation (Signed)
Anesthesia Post Note  Patient: Sheila Obrien  Procedure(s) Performed: cervical four-cervical five anterior cervical decompression and fusion     Patient location during evaluation: PACU Anesthesia Type: General Level of consciousness: awake and alert Pain management: pain level controlled Vital Signs Assessment: post-procedure vital signs reviewed and stable Respiratory status: spontaneous breathing, nonlabored ventilation and respiratory function stable Cardiovascular status: blood pressure returned to baseline and stable Postop Assessment: no apparent nausea or vomiting Anesthetic complications: no   No notable events documented.  Last Vitals:  Vitals:   05/20/21 0317 05/20/21 0753  BP: (!) 102/57 (!) 94/53  Pulse: 78 81  Resp: 18 18  Temp: 37 C 36.9 C  SpO2: 99% 99%    Last Pain:  Vitals:   05/20/21 0753  TempSrc: Oral  PainSc:                  Catalina Gravel

## 2021-05-22 ENCOUNTER — Encounter (HOSPITAL_COMMUNITY): Payer: Self-pay | Admitting: Neurosurgery

## 2021-05-31 ENCOUNTER — Other Ambulatory Visit (HOSPITAL_COMMUNITY): Payer: Self-pay

## 2021-06-02 ENCOUNTER — Other Ambulatory Visit (HOSPITAL_COMMUNITY): Payer: Self-pay

## 2021-06-02 DIAGNOSIS — F3181 Bipolar II disorder: Secondary | ICD-10-CM | POA: Diagnosis not present

## 2021-06-02 DIAGNOSIS — F411 Generalized anxiety disorder: Secondary | ICD-10-CM | POA: Diagnosis not present

## 2021-06-02 DIAGNOSIS — F339 Major depressive disorder, recurrent, unspecified: Secondary | ICD-10-CM | POA: Diagnosis not present

## 2021-06-02 MED ORDER — ARIPIPRAZOLE 5 MG PO TABS
5.0000 mg | ORAL_TABLET | Freq: Every day | ORAL | 0 refills | Status: DC
  Filled 2021-06-02: qty 90, 90d supply, fill #0

## 2021-06-28 ENCOUNTER — Encounter: Payer: Self-pay | Admitting: Family Medicine

## 2021-06-28 ENCOUNTER — Ambulatory Visit (INDEPENDENT_AMBULATORY_CARE_PROVIDER_SITE_OTHER): Payer: 59 | Admitting: Family Medicine

## 2021-06-28 ENCOUNTER — Ambulatory Visit: Payer: Self-pay

## 2021-06-28 VITALS — BP 118/76 | Ht 64.0 in | Wt 137.0 lb

## 2021-06-28 DIAGNOSIS — M25571 Pain in right ankle and joints of right foot: Secondary | ICD-10-CM

## 2021-06-28 NOTE — Progress Notes (Signed)
PCP: Rita Ohara, MD  Subjective:   HPI: Patient is a 51 y.o. female here for right ankle pain.  Patient reports she's had about 2.5 months of lateral right ankle pain. Noticed she had been changing her gait as a result. Then about 3-4 days ago she inverted her right ankle and felt/heard a pop lateral ankle. Associated swelling, difficulty walking and feeling of right ankle being unstable since then.  Past Medical History:  Diagnosis Date   Anxiety    Arthritis    Cervical radiculopathy 05/02/2021   Depression    Eczema    Low back pain    intermittent problems with SI joint   Right carpal tunnel syndrome 05/02/2021   Sensorineural hearing loss    left ear, since childhood   Ureteral reflux    s/p surgery    Current Outpatient Medications on File Prior to Visit  Medication Sig Dispense Refill   ARIPiprazole (ABILIFY) 5 MG tablet Take 1 tablet (5 mg total) by mouth daily. 30 tablet 1   ARIPiprazole (ABILIFY) 5 MG tablet Take 1 tablet (5 mg total) by mouth daily. 90 tablet 0   buPROPion (WELLBUTRIN XL) 300 MG 24 hr tablet Take 1 tablet (300 mg total) by mouth daily. 90 tablet 3   cholecalciferol (VITAMIN D3) 25 MCG (1000 UNIT) tablet Take 1,000 Units by mouth daily.     cyclobenzaprine (FLEXERIL) 10 MG tablet Take 1 tablet (10 mg total) by mouth 3 (three) times daily as needed for muscle spasms. 30 tablet 0   escitalopram (LEXAPRO) 20 MG tablet Take 1 tablet (20 mg total) by mouth daily. 90 tablet 3   oxyCODONE-acetaminophen (PERCOCET/ROXICET) 5-325 MG tablet Take 1-2 tablets by mouth every 4 (four) hours as needed for moderate pain. 30 tablet 0   No current facility-administered medications on file prior to visit.    Past Surgical History:  Procedure Laterality Date   ANTERIOR CERVICAL DECOMP/DISCECTOMY FUSION N/A 05/19/2021   Procedure: cervical four-cervical five anterior cervical decompression and fusion;  Surgeon: Kary Kos, MD;  Location: [place];  Service: Neurosurgery;   Laterality: N/A;  3C   bilateral ureteral reimplantation  05/1990   Dr.Humphries   CESAREAN SECTION  2004    Allergies  Allergen Reactions   Penicillins Hives    BP 118/76    Ht 5\' 4"  (1.626 m)    Wt 137 lb (62.1 kg)    BMI 23.52 kg/m   [facility] 06/28/2021  Frequency of aerobic exercise (# of days/week) 1  Average time in minutes 30  Frequency of strengthening activities (# of days/week) 1    No flowsheet data found.      Objective:  Physical Exam:  Gen: NAD, comfortable in exam room  Right ankle: Lateral swelling.  No bruising, other deformity. FROM but pain on external rotation. TTP over peroneal tendons.  No atfl, fibular, other tenderness Negative ant drawer and negative talar tilt.   Negative syndesmotic compression. Thompsons test negative. NV intact distally.   Limited MSK u/s right ankle: complete tear of peroneus brevis tendon with mod retraction.  Associated hemorrhage noted in tendon sheath.  Peroneus longus is intact without abnormalities.  Assessment & Plan:  1. Right ankle injury - 2/2 peroneus brevis tendon rupture.  Longus is intact and she's compensating well with good strength.  ASO for support.  Rest, ice, aleve or ibuprofen, elevation for next 2 weeks.  Motion exercises.  Plan to start strengthening at f/u in 2 weeks.  Discussed if she has persistent instability despite conservative measures over 6-12 weeks may need surgical intervention.

## 2021-06-28 NOTE — Patient Instructions (Signed)
You ruptured your peroneus brevis tendon. Ice the area for 15 minutes at a time, 3-4 times a day Aleve 2 tabs twice a day with food OR ibuprofen 3 tabs three times a day with food for pain and inflammation as needed. Elevate above the level of your heart when possible Use ankle brace when up and walking around to help with stability while you recover from this injury. Come out of the brace twice a day to do Up/down and alphabet exercises 2-3 sets of each. Consider physical therapy for strengthening and balance exercises in the future. Follow up in 2 weeks.

## 2021-06-29 DIAGNOSIS — M5412 Radiculopathy, cervical region: Secondary | ICD-10-CM | POA: Diagnosis not present

## 2021-07-06 ENCOUNTER — Other Ambulatory Visit (HOSPITAL_COMMUNITY): Payer: Self-pay

## 2021-07-12 ENCOUNTER — Ambulatory Visit (INDEPENDENT_AMBULATORY_CARE_PROVIDER_SITE_OTHER): Payer: 59 | Admitting: Family Medicine

## 2021-07-12 ENCOUNTER — Encounter: Payer: Self-pay | Admitting: Family Medicine

## 2021-07-12 DIAGNOSIS — M25571 Pain in right ankle and joints of right foot: Secondary | ICD-10-CM

## 2021-07-12 NOTE — Progress Notes (Signed)
PCP: Rita Ohara, MD  Subjective:   HPI: Patient is a 51 y.o. female here for right ankle pain.  1/11: Patient reports she's had about 2.5 months of lateral right ankle pain. Noticed she had been changing her gait as a result. Then about 3-4 days ago she inverted her right ankle and felt/heard a pop lateral ankle. Associated swelling, difficulty walking and feeling of right ankle being unstable since then.  1/25: Sheila Obrien reports she has improved since last visit. Wearing ASO brace when up and walking around. Ankle still feels unstable. Doing motion exercises, elevating and icing. No new injuries.  Past Medical History:  Diagnosis Date   Anxiety    Arthritis    Cervical radiculopathy 05/02/2021   Depression    Eczema    Low back pain    intermittent problems with SI joint   Right carpal tunnel syndrome 05/02/2021   Sensorineural hearing loss    left ear, since childhood   Ureteral reflux    s/p surgery    Current Outpatient Medications on File Prior to Visit  Medication Sig Dispense Refill   ARIPiprazole (ABILIFY) 5 MG tablet Take 1 tablet (5 mg total) by mouth daily. 30 tablet 1   ARIPiprazole (ABILIFY) 5 MG tablet Take 1 tablet (5 mg total) by mouth daily. 90 tablet 0   buPROPion (WELLBUTRIN XL) 300 MG 24 hr tablet Take 1 tablet (300 mg total) by mouth daily. 90 tablet 3   cholecalciferol (VITAMIN D3) 25 MCG (1000 UNIT) tablet Take 1,000 Units by mouth daily.     cyclobenzaprine (FLEXERIL) 10 MG tablet Take 1 tablet (10 mg total) by mouth 3 (three) times daily as needed for muscle spasms. 30 tablet 0   escitalopram (LEXAPRO) 20 MG tablet Take 1 tablet (20 mg total) by mouth daily. 90 tablet 3   oxyCODONE-acetaminophen (PERCOCET/ROXICET) 5-325 MG tablet Take 1-2 tablets by mouth every 4 (four) hours as needed for moderate pain. 30 tablet 0   No current facility-administered medications on file prior to visit.    Past Surgical History:  Procedure Laterality Date    ANTERIOR CERVICAL DECOMP/DISCECTOMY FUSION N/A 05/19/2021   Procedure: cervical four-cervical five anterior cervical decompression and fusion;  Surgeon: Kary Kos, MD;  Location: [facility];  Service: Neurosurgery;  Laterality: N/A;  3C   bilateral ureteral reimplantation  05/1990   Dr.Humphries   CESAREAN SECTION  2004    Allergies  Allergen Reactions   Penicillins Hives    BP (!) 97/50    Ht 5\' 4"  (1.626 m)    Wt 139 lb (63 kg)    BMI 23.86 kg/m   [facility] 06/28/2021  Frequency of aerobic exercise (# of days/week) 1  Average time in minutes 30  Frequency of strengthening activities (# of days/week) 1    No flowsheet data found.      Objective:  Physical Exam:  Gen: NAD, comfortable in exam room  Right ankle: Mild lateral swelling.  No bruising, other deformity. FROM with minimal pain external rotation. No current TTP Negative ant drawer and negative talar tilt.   Thompsons test negative. NV intact distally. Assessment & Plan:  1. Right ankle injury - 2/2 peroneus brevis tendon rupture.  Longus intact.  2.5 weeks out from rupture.  Will start strengthening exercises as this time - she will do home exercises but call us if she's struggling and will add formal physical therapy.  Ice, aleve or ibuprofen, elevation if needed.  F/u in  4 weeks.

## 2021-08-01 DIAGNOSIS — Z1231 Encounter for screening mammogram for malignant neoplasm of breast: Secondary | ICD-10-CM | POA: Diagnosis not present

## 2021-08-01 LAB — HM MAMMOGRAPHY

## 2021-08-05 DIAGNOSIS — F3181 Bipolar II disorder: Secondary | ICD-10-CM | POA: Diagnosis not present

## 2021-08-05 DIAGNOSIS — F411 Generalized anxiety disorder: Secondary | ICD-10-CM | POA: Diagnosis not present

## 2021-08-05 DIAGNOSIS — F339 Major depressive disorder, recurrent, unspecified: Secondary | ICD-10-CM | POA: Diagnosis not present

## 2021-08-07 ENCOUNTER — Other Ambulatory Visit (HOSPITAL_COMMUNITY): Payer: Self-pay

## 2021-08-07 ENCOUNTER — Encounter: Payer: Self-pay | Admitting: *Deleted

## 2021-08-07 MED ORDER — BUPROPION HCL ER (XL) 300 MG PO TB24
300.0000 mg | ORAL_TABLET | Freq: Every morning | ORAL | 2 refills | Status: DC
  Filled 2021-08-07: qty 90, 90d supply, fill #0
  Filled 2021-08-07: qty 30, 30d supply, fill #0

## 2021-08-07 MED ORDER — ESCITALOPRAM OXALATE 20 MG PO TABS
20.0000 mg | ORAL_TABLET | Freq: Every day | ORAL | 2 refills | Status: DC
  Filled 2021-08-07 – 2021-08-30 (×2): qty 30, 30d supply, fill #0
  Filled 2021-09-28: qty 30, 30d supply, fill #1
  Filled 2021-10-30: qty 30, 30d supply, fill #2

## 2021-08-07 MED ORDER — ARIPIPRAZOLE 5 MG PO TABS
5.0000 mg | ORAL_TABLET | Freq: Every day | ORAL | 2 refills | Status: DC
  Filled 2021-08-07 – 2021-09-04 (×2): qty 30, 30d supply, fill #0
  Filled 2021-10-03: qty 30, 30d supply, fill #1
  Filled 2022-01-02: qty 30, 30d supply, fill #2

## 2021-08-08 DIAGNOSIS — M5412 Radiculopathy, cervical region: Secondary | ICD-10-CM | POA: Diagnosis not present

## 2021-08-09 ENCOUNTER — Encounter: Payer: Self-pay | Admitting: Family Medicine

## 2021-08-09 ENCOUNTER — Ambulatory Visit (INDEPENDENT_AMBULATORY_CARE_PROVIDER_SITE_OTHER): Payer: 59 | Admitting: Family Medicine

## 2021-08-09 VITALS — BP 112/80 | Ht 64.0 in | Wt 139.0 lb

## 2021-08-09 DIAGNOSIS — M25571 Pain in right ankle and joints of right foot: Secondary | ICD-10-CM | POA: Diagnosis not present

## 2021-08-09 NOTE — Patient Instructions (Signed)
Start physical therapy and do home exercises on days you don't go to therapy. Continue wearing the brace when up and walking around. Icing if needed 15 minutes at a time. Follow up with me in 6 weeks for reevaluation.

## 2021-08-09 NOTE — Progress Notes (Signed)
PCP: Rita Ohara, MD  Subjective:   HPI: Patient is a 51 y.o. female here for right ankle pain.  1/11: Patient reports she's had about 2.5 months of lateral right ankle pain. Noticed she had been changing her gait as a result. Then about 3-4 days ago she inverted her right ankle and felt/heard a pop lateral ankle. Associated swelling, difficulty walking and feeling of right ankle being unstable since then.  1/25: Azie reports she has improved since last visit. Wearing ASO brace when up and walking around. Ankle still feels unstable. Doing motion exercises, elevating and icing. No new injuries.  2/22: Patient reports she continues to slowly improve. Doing home exercises. Wearing ankle brace when up and walking around - doesn't trust ankle without the brace. Some discomfort laterally but improved.  Past Medical History:  Diagnosis Date   Anxiety    Arthritis    Cervical radiculopathy 05/02/2021   Depression    Eczema    Low back pain    intermittent problems with SI joint   Right carpal tunnel syndrome 05/02/2021   Sensorineural hearing loss    left ear, since childhood   Ureteral reflux    s/p surgery    Current Outpatient Medications on File Prior to Visit  Medication Sig Dispense Refill   ARIPiprazole (ABILIFY) 5 MG tablet Take 1 tablet (5 mg total) by mouth daily. 30 tablet 1   ARIPiprazole (ABILIFY) 5 MG tablet Take 1 tablet (5 mg total) by mouth daily. 90 tablet 0   ARIPiprazole (ABILIFY) 5 MG tablet Take 1 tablet (5 mg total) by mouth daily. 30 tablet 2   buPROPion (WELLBUTRIN XL) 300 MG 24 hr tablet Take 1 tablet (300 mg total) by mouth daily. 90 tablet 3   buPROPion (WELLBUTRIN XL) 300 MG 24 hr tablet Take 1 tablet (300 mg total) by mouth every morning. 30 tablet 2   cholecalciferol (VITAMIN D3) 25 MCG (1000 UNIT) tablet Take 1,000 Units by mouth daily.     cyclobenzaprine (FLEXERIL) 10 MG tablet Take 1 tablet (10 mg total) by mouth 3 (three) times daily as  needed for muscle spasms. 30 tablet 0   escitalopram (LEXAPRO) 20 MG tablet Take 1 tablet (20 mg total) by mouth daily. 90 tablet 3   escitalopram (LEXAPRO) 20 MG tablet Take 1 tablet (20 mg total) by mouth at bedtime. 30 tablet 2   oxyCODONE-acetaminophen (PERCOCET/ROXICET) 5-325 MG tablet Take 1-2 tablets by mouth every 4 (four) hours as needed for moderate pain. 30 tablet 0   No current facility-administered medications on file prior to visit.    Past Surgical History:  Procedure Laterality Date   ANTERIOR CERVICAL DECOMP/DISCECTOMY FUSION N/A 05/19/2021   Procedure: cervical four-cervical five anterior cervical decompression and fusion;  Surgeon: Kary Kos, MD;  Location: [place];  Service: Neurosurgery;  Laterality: N/A;  3C   bilateral ureteral reimplantation  05/1990   Dr.Humphries   CESAREAN SECTION  2004    Allergies  Allergen Reactions   Penicillins Hives    BP 112/80    Ht 5\' 4"  (1.626 m)    Wt 139 lb (63 kg)    BMI 23.86 kg/m   [facility] 06/28/2021  Frequency of aerobic exercise (# of days/week) 1  Average time in minutes 30  Frequency of strengthening activities (# of days/week) 1    No flowsheet data found.      Objective:  Physical Exam:  Gen: NAD, comfortable in exam room  Right  ankle: No gross deformity, swelling, ecchymoses FROM with 5/5 strength all directions without pain Minimal TTP over peroneals Negative ant drawer and negative talar tilt.   Negative syndesmotic compression. Thompsons test negative. NV intact distally.  Assessment & Plan:  1. Right ankle injury - 2/2 peroneus brevis tendon rupture.  Longus intact.  6 weeks out from rupture.  Still with some instability - start formal physical therapy and continue home exercises.  ASO for support.  Icing if needed.  F/u in 6 weeks.

## 2021-08-14 ENCOUNTER — Other Ambulatory Visit (HOSPITAL_COMMUNITY): Payer: Self-pay

## 2021-08-23 NOTE — Therapy (Signed)
OUTPATIENT PHYSICAL THERAPY LOWER EXTREMITY EVALUATION   Patient Name: Sheila Obrien MRN: [id number] DOB:07/19/1970, 51 y.o., female Today's Date: 08/24/2021   PT End of Session - 08/24/21 1729     Visit Number 1    Number of Visits 9    Date for PT Re-Evaluation 10/19/21    Authorization Type MCE    Authorization Time Period FOTO v6, v10    PT Start Time 1645    PT Stop Time 5916    PT Time Calculation (min) 45 min    Activity Tolerance Patient tolerated treatment well    Behavior During Therapy [facility] for tasks assessed/performed             Past Medical History:  Diagnosis Date   Anxiety    Arthritis    Cervical radiculopathy 05/02/2021   Depression    Eczema    Low back pain    intermittent problems with SI joint   Right carpal tunnel syndrome 05/02/2021   Sensorineural hearing loss    left ear, since childhood   Ureteral reflux    s/p surgery   Past Surgical History:  Procedure Laterality Date   ANTERIOR CERVICAL DECOMP/DISCECTOMY FUSION N/A 05/19/2021   Procedure: cervical four-cervical five anterior cervical decompression and fusion;  Surgeon: Kary Kos, MD;  Location: [place];  Service: Neurosurgery;  Laterality: N/A;  3C   bilateral ureteral reimplantation  05/1990   Dr.Humphries   CESAREAN SECTION  2004   Patient Active Problem List   Diagnosis Date Noted   Spondylosis of cervical joint 05/19/2021   Peroneal neuropathy 05/12/2019   Myelopathy ([place]) 04/11/2019   Plantar fasciitis of left foot 09/05/2017   Cavus deformity of foot 09/05/2017   Vitamin D deficiency 11/06/2016   Knee pain 02/19/2012   Fibrocystic breast 02/13/2012   Eczema 02/13/2012    PCP: Rita Ohara, MD  REFERRING PROVIDER: Dene Gentry, MD  REFERRING DIAG: [phone] (ICD-10-CM) - Right ankle pain, unspecified chronicity  THERAPY DIAG:  Pain in right ankle and joints of right foot  Difficulty in walking, not elsewhere classified  Muscle weakness (generalized)  ONSET  DATE: 06/25/2021  SUBJECTIVE:   SUBJECTIVE STATEMENT: Pt reports primary c/o Rt lateral foot/ ankle pain of insidious onset starting in October of last year. On January 8th, she reports that when standing from a chair, she experienced a Rt lateral ankle sprain. She reports that she went to Dr. Barbaraann Barthel in sports medicine 3 days after the incident, and after diagnostic ultrasound, he diagnosed her with a  torn peroneus brevis with moderate retraction. For treatment since this time, she has been doing exercises including ankle alphabets, heel raises, SLS, and banded ankle exercises. She reports wearing a soft ankle brace from 06/28/2021 until 2 weeks ago. She reports improved balance, pain, and strength since that time, although she still reports ankle instability, and low-level pain. Pt denies pain currently. Worst pain over the past 2 weeks is 1-2/10. Aggravating factors include prolonged walking, such as the end of a work day (the pt works as a Astronomer in the hospital). Easing factors include rest and ice. Pt denies unexplained weight change, nausea/ vomiting, N/T, or unrelenting night pain.  PERTINENT HISTORY: Lt lateral ankle sprain with torn peroneus brevis on 06/25/2021  PAIN:  Are you having pain? No NPRS scale: 0/10 Pain location: Rt lateral ankle PAIN TYPE: aching and dull Pain description: intermittent  Aggravating factors: Prolonged walking Relieving factors: rest, ice  PRECAUTIONS: None  WEIGHT BEARING  RESTRICTIONS No  FALLS:  Has patient fallen in last 6 months? No, Number of falls: 0  LIVING ENVIRONMENT: Lives with: lives with their family Lives in: House/apartment Stairs: No;  Has following equipment at home: None  OCCUPATION: Speech therapist  PLOF: Independent  PATIENT GOALS Return to working full work days, return to cardio exercises   Screening for Suicide  Answer the following questions with Yes or No and place an "x" beside the action taken.  1. Over  the past two weeks, have you felt down, depressed, or hopeless?   No  2. Within the past two weeks, have you felt little interest or pleasure in life?  No  If YES to either #1 or #2, then ask #3  3. Have you had thoughts that that life is not worth living or that you might be       better off dead?     If answer is NO and suspicion is low, then end   4. Over this past week, have you had any thoughts about hurting or even killing yourself?    If NO, then end. Patient in no immediate danger   5. If so, do you believe that you intend to or will harm yourself?       If NO, then end. Patient in no immediate danger   6.  Do you have a plan as to how you would hurt yourself?     7.  Over this past week, have you actually done anything to hurt yourself?    IF YES answers to either #4, #5, #6 or #7, then patient is AT RISK for suicide   Actions Taken  __X__  Screening negative; no further action required  ____  Screening positive; no immediate danger and patient already in treatment with a  mental health provider. Advise patient to speak to their mental health provider.  ____  Screening positive; no immediate danger. Patient advised to contact a mental  health provider for further assessment.   ____  Screening positive; in immediate danger as patient states intention of killing self,  has plan and a sense of imminence. Do not leave alone. Seek permission from  patient to contact a family member to inform them. Direct patient to go to ED.   OBJECTIVE:   DIAGNOSTIC FINDINGS: 06/28/2021: Limited MSK u/s right ankle: IMPRESSION: complete tear of peroneus brevis tendon with mod retraction.  Associated hemorrhage noted in tendon sheath.  Peroneus longus is intact without abnormalities.  PATIENT SURVEYS:  FOTO 64%, projected 74% in 11 visits  COGNITION:  Overall cognitive status: Within functional limits for tasks assessed     SENSATION:  Light touch: Appears intact  MUSCLE  LENGTH: Gastroc: Moderate tightness BIL Soleus: Moderate tightness BIL  POSTURE:  BIL pes cavus, 6 degrees of calcaneal inversion on Lt, 8 degrees of calcaneal inversion on Rt  PALPATION: TTP to Rt peroneal insertion  LE AROM/PROM:  A/PROM Right 08/24/2021 Left 08/24/2021  Ankle dorsiflexion 5/10 5/7  Ankle plantarflexion 55/60 55/60  Ankle inversion 50/65p! 40/60  Ankle eversion 5/20 10/30   (Blank rows = not tested)  LE MMT:  MMT Right 08/24/2021 Left 08/24/2021  Hip flexion 5/5 5/5  Hip extension 3/5 3/5  Hip abduction 4/5 4+/5  Ankle dorsiflexion 5/5 5/5  Ankle plantarflexion 5/5 5/5  Ankle inversion 5/5 5/5  Ankle eversion 4+/5p! 5/5   (Blank rows = not tested)  LOWER EXTREMITY SPECIAL TESTS:  Anterior drawer: (-) Posterior drawer: (-) Medial/  lateral talar tilt: hypermobile medial talar tilt on Rt  FUNCTIONAL TESTS:  DL heel raise x25: Noted calcaneal inversion throughout SL heel raise x15: Noted calcaneal inversion throughout BIL, Squat: WNL  GAIT: Distance walked: 20 ft Assistive device utilized: None Level of assistance: Complete Independence Comments: Calcaneal inversion, pes cavus throughout gait    TODAY'S TREATMENT: 08/24/2021 Issued and demonstrated HEP   PATIENT EDUCATION:  Education details: Pt educated on prognosis, POC, FOTO, and HEP Person educated: Patient Education method: Consulting civil engineer, Media planner, and Handouts Education comprehension: verbalized understanding and returned demonstration   HOME EXERCISE PROGRAM: Access Code: [email or web address] URL: [email or web address] Date: 08/24/2021 Prepared by: Vanessa Marseilles  Exercises Heel raise with band AROUND REARFOOT, Not ankle - 1 x daily - 7 x weekly - 3 sets - 10 reps - 3-sec hold Gastroc Stretch on Wall - 1 x daily - 7 x weekly - 2 sets - 1-min hold Supine Bridge - 1 x daily - 7 x weekly - 3 sets - 10 reps - 3-sec hold   ASSESSMENT:  CLINICAL IMPRESSION: Patient is a 51  y.o. F who was seen today for physical therapy evaluation and treatment for Rt lateral ankle pain s/p inversion ankle sprain on 06/25/2021. Upon assessment, her primary impairments include TTP to Rt peroneus insertion, painful and weak Rt eversion MMT, hypermobile medial talar tilting on Rt, tight BIL gastrocnemius and soleus, increased BIL calcaneal inversion, and decreased control with heel raises.   OBJECTIVE IMPAIRMENTS Abnormal gait, decreased balance, difficulty walking, decreased ROM, decreased strength, impaired flexibility, improper body mechanics, and pain.   ACTIVITY LIMITATIONS occupation, yard work, and shopping.   PERSONAL FACTORS 3+ comorbidities: anxiety, depression, arthritis  are also affecting patient's functional outcome.    REHAB POTENTIAL: Good  CLINICAL DECISION MAKING: Stable/uncomplicated  EVALUATION COMPLEXITY: Low   GOALS: Goals reviewed with patient? Yes  SHORT TERM GOALS:  Pt will report understanding and adherence to her HEP in order to promote independence in the management of her primary impairments. Baseline: HEP provided at eval Target date: 09/21/2021 Goal status: INITIAL   LONG TERM GOALS:  Pt will achieve a FOTO score of 74% in order to demonstrate improved functional ability as it relates to her primary impairments. Baseline: 64% Target date: 10/19/2021 Goal status: INITIAL  2.  Pt will report ability to stand/ walk through an entire workday with 0/10 pain in order to perform her work duties with less distraction. Baseline: 2/10 pain at end of workday Target date: 10/19/2021 Goal status: INITIAL  3.  Pt will demonstrate 15 WNL SL heel raises with good medial control in order to return to cardio workouts with less imbalance. Baseline: pt demonstrates lack of medial control with heel raises, with increased calcaneal eversion Target date: 10/19/2021 Goal status: INITIAL  4.  Pt will achieve BIL dorsiflexion AROM of 10 degrees to promote WNL gait  during exercise. Baseline: 5 degrees BIL Target date: 10/19/2021 Goal status: INITIAL   PLAN: PT FREQUENCY: 1x/week  PT DURATION: 8 weeks  PLANNED INTERVENTIONS: Therapeutic exercises, Therapeutic activity, Neuromuscular re-education, Balance training, Gait training, Patient/Family education, Joint mobilization, Stair training, Dry Needling, Electrical stimulation, Cryotherapy, Moist heat, Taping, Vasopneumatic device, Ionotophoresis '4mg'$ /ml Dexamethasone, and Manual therapy  PLAN FOR NEXT SESSION: Progress lateral ankle strengthening/ stability exercises   Vanessa Uinta, PT, DPT 08/24/21 5:42 PM

## 2021-08-24 ENCOUNTER — Ambulatory Visit: Payer: 59 | Attending: Family Medicine

## 2021-08-24 ENCOUNTER — Other Ambulatory Visit: Payer: Self-pay

## 2021-08-24 DIAGNOSIS — M6281 Muscle weakness (generalized): Secondary | ICD-10-CM | POA: Insufficient documentation

## 2021-08-24 DIAGNOSIS — M25571 Pain in right ankle and joints of right foot: Secondary | ICD-10-CM | POA: Insufficient documentation

## 2021-08-24 DIAGNOSIS — R262 Difficulty in walking, not elsewhere classified: Secondary | ICD-10-CM | POA: Diagnosis not present

## 2021-08-30 ENCOUNTER — Other Ambulatory Visit (HOSPITAL_COMMUNITY): Payer: Self-pay

## 2021-08-31 ENCOUNTER — Ambulatory Visit: Payer: 59 | Admitting: Physical Therapy

## 2021-08-31 ENCOUNTER — Encounter: Payer: Self-pay | Admitting: Physical Therapy

## 2021-08-31 ENCOUNTER — Other Ambulatory Visit: Payer: Self-pay

## 2021-08-31 DIAGNOSIS — R262 Difficulty in walking, not elsewhere classified: Secondary | ICD-10-CM | POA: Diagnosis not present

## 2021-08-31 DIAGNOSIS — M6281 Muscle weakness (generalized): Secondary | ICD-10-CM | POA: Diagnosis not present

## 2021-08-31 DIAGNOSIS — M25571 Pain in right ankle and joints of right foot: Secondary | ICD-10-CM | POA: Diagnosis not present

## 2021-08-31 NOTE — Therapy (Signed)
?OUTPATIENT PHYSICAL THERAPY TREATMENT NOTE ? ? ?Patient Name: Sheila Obrien ?MRN: [id number] ?DOB:1970/11/01, 51 y.o., female ?Today's Date: 08/31/2021 ? ?PCP: Rita Ohara, MD ?REFERRING PROVIDER: Rita Ohara, MD ? ? PT End of Session - 08/31/21 1613   ? ? Visit Number 2   ? Number of Visits 9   ? Date for PT Re-Evaluation 10/19/21   ? Authorization Type MCE   ? Authorization Time Period FOTO v6, v10   ? PT Start Time 1615   ? PT Stop Time 3664   ? PT Time Calculation (min) 43 min   ? Activity Tolerance Patient tolerated treatment well   ? Behavior During Therapy [facility] for tasks assessed/performed   ? ?  ?  ? ?  ? ? ?Past Medical History:  ?Diagnosis Date  ? Anxiety   ? Arthritis   ? Cervical radiculopathy 05/02/2021  ? Depression   ? Eczema   ? Low back pain   ? intermittent problems with SI joint  ? Right carpal tunnel syndrome 05/02/2021  ? Sensorineural hearing loss   ? left ear, since childhood  ? Ureteral reflux   ? s/p surgery  ? ?Past Surgical History:  ?Procedure Laterality Date  ? ANTERIOR CERVICAL DECOMP/DISCECTOMY FUSION N/A 05/19/2021  ? Procedure: cervical four-cervical five anterior cervical decompression and fusion;  Surgeon: Kary Kos, MD;  Location: [place];  Service: Neurosurgery;  Laterality: N/A;  3C  ? bilateral ureteral reimplantation  05/1990  ? Dr.Humphries  ? CESAREAN SECTION  2004  ? ?Patient Active Problem List  ? Diagnosis Date Noted  ? Spondylosis of cervical joint 05/19/2021  ? Peroneal neuropathy 05/12/2019  ? Myelopathy ([place]) 04/11/2019  ? Plantar fasciitis of left foot 09/05/2017  ? Cavus deformity of foot 09/05/2017  ? Vitamin D deficiency 11/06/2016  ? Knee pain 02/19/2012  ? Fibrocystic breast 02/13/2012  ? Eczema 02/13/2012  ? ? ?THERAPY DIAG:  ?Pain in right ankle and joints of right foot ? ?Difficulty in walking, not elsewhere classified ? ?Muscle weakness (generalized) ? ?REFERRING DIAG: M25.571 (ICD-10-CM) - Right ankle pain, unspecified chronicity ? ?PERTINENT HISTORY: Rt  lateral ankle sprain with torn peroneus brevis on 06/25/2021 ? ?PRECAUTIONS/RESTRICTIONS:  ? ?none ? ?SUBJECTIVE:  ?Pt reports that things are going well.  She is surprised that she is so sore. ? ?PAIN:  ?Are you having pain? No ?NPRS scale: 0/10 ?Pain location: Rt lateral ankle ?PAIN TYPE: aching and dull ?Pain description: intermittent  ?Aggravating factors: Prolonged walking ?Relieving factors: rest, ice ? ?OBJECTIVE: ? ?MUSCLE LENGTH: ?Gastroc: Moderate tightness BIL ?Soleus: Moderate tightness BIL ?  ?POSTURE:  ?BIL pes cavus, 6 degrees of calcaneal inversion on Lt, 8 degrees of calcaneal inversion on Rt ?  ?PALPATION: ?TTP to Rt peroneal insertion ?  ?LE AROM/PROM: ?  ?A/PROM Right ?08/24/2021 Left ?08/24/2021  ?Ankle dorsiflexion 5/10 5/7  ?Ankle plantarflexion 55/60 55/60  ?Ankle inversion 50/65p! 40/60  ?Ankle eversion 5/20 10/30  ? (Blank rows = not tested) ?  ?LE MMT: ?  ?MMT Right ?08/24/2021 Left ?08/24/2021  ?Hip flexion 5/5 5/5  ?Hip extension 3/5 3/5  ?Hip abduction 4/5 4+/5  ?Ankle dorsiflexion 5/5 5/5  ?Ankle plantarflexion 5/5 5/5  ?Ankle inversion 5/5 5/5  ?Ankle eversion 4+/5p! 5/5  ? (Blank rows = not tested) ?  ?LOWER EXTREMITY SPECIAL TESTS:  ?Anterior drawer: (-) ?Posterior drawer: (-) ?Medial/ lateral talar tilt: hypermobile medial talar tilt on Rt ?  ?FUNCTIONAL TESTS:  ?DL heel raise x25: Noted calcaneal inversion throughout ?  SL heel raise x15: Noted calcaneal inversion throughout BIL, ?Squat: WNL ? ?HOME EXERCISE PROGRAM: ?Access Code: [email or web address] ?URL: [email or web address] ?Date: 08/31/2021 ?Prepared by: Shearon Balo ? ?Exercises ?Heel raise with band AROUND REARFOOT, Not ankle - 1 x daily - 7 x weekly - 3 sets - 10 reps - 3-sec hold ?Gastroc Stretch on Wall - 1 x daily - 7 x weekly - 2 sets - 1-min hold ?Staggered Bridge - 1 x daily - 7 x weekly - 3 sets - 10 reps ?Side Plank with Clam and Resistance - 1 x daily - 7 x weekly - 3 sets - 10 reps ? ? ? ?TREATMENT  3/16: ? ?Therapeutic Exercise: ?- Recumbent bike 5' for warm up while taking subjective ?- staggered bridge 3x10 ?- side plank with clam RTB 3x10 ?- rolling bil gastroc ?- slant board stretch - 45'' x3 ?- heel raise on step with tennis ball to increase inversion - 15x ? - S/L eccentric lowering ?-RDL with slider ? ?  ?ASSESSMENT: ?  ?CLINICAL IMPRESSION: ?Sh[place] tolerated session well.  She has significant fatigue in hip extensors and abductors during mat exercises; HEP updated.  Significant weakness in R plantar flexors vs L during eccentric S/L heel lower.  Will continue to progress balance and hip strength.  Continue per POC. ?  ?  ?OBJECTIVE IMPAIRMENTS Abnormal gait, decreased balance, difficulty walking, decreased ROM, decreased strength, impaired flexibility, improper body mechanics, and pain.  ?  ?ACTIVITY LIMITATIONS occupation, yard work, and shopping.  ?  ?PERSONAL FACTORS 3+ comorbidities: anxiety, depression, arthritis  are also affecting patient's functional outcome.  ?  ?  ?REHAB POTENTIAL: Good ?  ?CLINICAL DECISION MAKING: Stable/uncomplicated ?  ?EVALUATION COMPLEXITY: Low ?  ?  ?GOALS: ?Goals reviewed with patient? Yes ?  ?SHORT TERM GOALS: ?  ?Pt will report understanding and adherence to her HEP in order to promote independence in the management of her primary impairments. ?Baseline: HEP provided at eval ?Target date: 09/21/2021 ?Goal status: INITIAL ?  ?  ?LONG TERM GOALS: ?  ?Pt will achieve a FOTO score of 74% in order to demonstrate improved functional ability as it relates to her primary impairments. ?Baseline: 64% ?Target date: 10/19/2021 ?Goal status: INITIAL ?  ?2.  Pt will report ability to stand/ walk through an entire workday with 0/10 pain in order to perform her work duties with less distraction. ?Baseline: 2/10 pain at end of workday ?Target date: 10/19/2021 ?Goal status: INITIAL ?  ?3.  Pt will demonstrate 15 WNL SL heel raises with good medial control in order to return to cardio  workouts with less imbalance. ?Baseline: pt demonstrates lack of medial control with heel raises, with increased calcaneal eversion ?Target date: 10/19/2021 ?Goal status: INITIAL ?  ?4.  Pt will achieve BIL dorsiflexion AROM of 10 degrees to promote WNL gait during exercise. ?Baseline: 5 degrees BIL ?Target date: 10/19/2021 ?Goal status: INITIAL ?  ?  ?PLAN: ?PT FREQUENCY: 1x/week ?  ?PT DURATION: 8 weeks ?  ?PLANNED INTERVENTIONS: Therapeutic exercises, Therapeutic activity, Neuromuscular re-education, Balance training, Gait training, Patient/Family education, Joint mobilization, Stair training, Dry Needling, Electrical stimulation, Cryotherapy, Moist heat, Taping, Vasopneumatic device, Ionotophoresis '4mg'$ /ml Dexamethasone, and Manual therapy ?  ?PLAN FOR NEXT SESSION: Progress lateral ankle strengthening/ stability exercises ? ? ?Mathis Dad PT ?08/31/2021, 5:07 PM ? ?  ?

## 2021-09-04 ENCOUNTER — Other Ambulatory Visit (HOSPITAL_COMMUNITY): Payer: Self-pay

## 2021-09-05 NOTE — Therapy (Signed)
?OUTPATIENT PHYSICAL THERAPY TREATMENT NOTE ? ? ?Patient Name: Sheila Obrien ?MRN: [id number] ?DOB:04/16/1971, 51 y.o., female ?Today's Date: 09/06/2021 ? ?PCP: Rita Ohara, MD ?REFERRING PROVIDER: Rita Ohara, MD ? ? PT End of Session - 09/06/21 1659   ? ? Visit Number 3   ? Number of Visits 9   ? Date for PT Re-Evaluation 10/19/21   ? Authorization Type MCE   ? Authorization Time Period FOTO v6, v10   ? PT Start Time 1700   ? PT Stop Time 1742   ? PT Time Calculation (min) 42 min   ? Equipment Utilized During Treatment Gait belt   ? Activity Tolerance Patient tolerated treatment well   ? Behavior During Therapy [facility] for tasks assessed/performed   ? ?  ?  ? ?  ? ? ? ?Past Medical History:  ?Diagnosis Date  ? Anxiety   ? Arthritis   ? Cervical radiculopathy 05/02/2021  ? Depression   ? Eczema   ? Low back pain   ? intermittent problems with SI joint  ? Right carpal tunnel syndrome 05/02/2021  ? Sensorineural hearing loss   ? left ear, since childhood  ? Ureteral reflux   ? s/p surgery  ? ?Past Surgical History:  ?Procedure Laterality Date  ? ANTERIOR CERVICAL DECOMP/DISCECTOMY FUSION N/A 05/19/2021  ? Procedure: cervical four-cervical five anterior cervical decompression and fusion;  Surgeon: Kary Kos, MD;  Location: [place];  Service: Neurosurgery;  Laterality: N/A;  3C  ? bilateral ureteral reimplantation  05/1990  ? Dr.Humphries  ? CESAREAN SECTION  2004  ? ?Patient Active Problem List  ? Diagnosis Date Noted  ? Spondylosis of cervical joint 05/19/2021  ? Peroneal neuropathy 05/12/2019  ? Myelopathy ([place]) 04/11/2019  ? Plantar fasciitis of left foot 09/05/2017  ? Cavus deformity of foot 09/05/2017  ? Vitamin D deficiency 11/06/2016  ? Knee pain 02/19/2012  ? Fibrocystic breast 02/13/2012  ? Eczema 02/13/2012  ? ? ?THERAPY DIAG:  ?Pain in right ankle and joints of right foot ? ?Difficulty in walking, not elsewhere classified ? ?Muscle weakness (generalized) ? ?REFERRING DIAG: M25.571 (ICD-10-CM) - Right ankle pain,  unspecified chronicity ? ?PERTINENT HISTORY: Rt lateral ankle sprain with torn peroneus brevis on 06/25/2021 ? ?PRECAUTIONS/RESTRICTIONS:  ? ?none ? ?SUBJECTIVE:  ?Pt denies any pain today, adding that she has had no trouble with her HEP. ? ?PAIN:  ?Are you having pain? No ?NPRS scale: 0/10 ?Pain location: Rt lateral ankle ?PAIN TYPE: aching and dull ?Pain description: intermittent  ?Aggravating factors: Prolonged walking ?Relieving factors: rest, ice ? ?OBJECTIVE: ? ?MUSCLE LENGTH: ?Gastroc: Moderate tightness BIL ?Soleus: Moderate tightness BIL ?  ?POSTURE:  ?BIL pes cavus, 6 degrees of calcaneal inversion on Lt, 8 degrees of calcaneal inversion on Rt ?  ?PALPATION: ?TTP to Rt peroneal insertion ?  ?LE AROM/PROM: ?  ?A/PROM Right ?08/24/2021 Left ?08/24/2021  ?Ankle dorsiflexion 5/10 5/7  ?Ankle plantarflexion 55/60 55/60  ?Ankle inversion 50/65p! 40/60  ?Ankle eversion 5/20 10/30  ? (Blank rows = not tested) ?  ?LE MMT: ?  ?MMT Right ?08/24/2021 Left ?08/24/2021  ?Hip flexion 5/5 5/5  ?Hip extension 3/5 3/5  ?Hip abduction 4/5 4+/5  ?Ankle dorsiflexion 5/5 5/5  ?Ankle plantarflexion 5/5 5/5  ?Ankle inversion 5/5 5/5  ?Ankle eversion 4+/5p! 5/5  ? (Blank rows = not tested) ?  ?LOWER EXTREMITY SPECIAL TESTS:  ?Anterior drawer: (-) ?Posterior drawer: (-) ?Medial/ lateral talar tilt: hypermobile medial talar tilt on Rt ?  ?FUNCTIONAL TESTS:  ?  DL heel raise x25: Noted calcaneal inversion throughout ?SL heel raise x15: Noted calcaneal inversion throughout BIL, ?Squat: WNL ? ?HOME EXERCISE PROGRAM: ?Access Code: [email or web address] ?URL: [email or web address] ?Date: 08/31/2021 ?Prepared by: Shearon Balo ? ?Exercises ?Heel raise with band AROUND REARFOOT, Not ankle - 1 x daily - 7 x weekly - 3 sets - 10 reps - 3-sec hold ?Gastroc Stretch on Wall - 1 x daily - 7 x weekly - 2 sets - 1-min hold ?Staggered Bridge - 1 x daily - 7 x weekly - 3 sets - 10 reps ?Side Plank with Clam and Resistance - 1 x daily - 7 x weekly - 3 sets -  10 reps ? ?[facility] Adult PT Treatment:                                                DATE: 09/06/2021 ?Therapeutic Exercise: ?SLS with heel on edge of Airex while tilting Tidal Tank side to side 3x20 tilts ?Standing slant board gastroc stretch x2 min  ?Forward lunge with foot on each side of Bosu ball and with forward/backward Tidal Tank perturbations 2x10 BIL on each side of Bosu ?Standing curtsy on Airex pad with medial/ lateral Tidal Tank tilting 2x10 BIL ?Bouncing heel raises on Airex pad 3x30 ?Tall-kneeling hip thrust on Airex pad with 23# cable at [employer] machine 2x10; 17# 1x10 ?Squat into false hop heel raise with waist attachment and two 7# cables at [employer] machine 3x10 ?Side steps with 13# cable on waist attachment at [employer] x5 laps BIL ?Manual Therapy: ?N/A ?Neuromuscular re-ed: ?N/A ?Therapeutic Activity: ?N/A ?Modalities: ?N/A ?Self Care: ?N/A ? ? ? ?TREATMENT 3/16: ?Therapeutic Exercise: ?- Recumbent bike 5' for warm up while taking subjective ?- staggered bridge 3x10 ?- side plank with clam RTB 3x10 ?- rolling bil gastroc ?- slant board stretch - 45'' x3 ?- heel raise on step with tennis ball to increase inversion - 15x ? - S/L eccentric lowering ?-RDL with slider ? ?  ?ASSESSMENT: ?  ?CLINICAL IMPRESSION: ?Pt responded excellently to all newly introduced exercises today, with good form and no increase in pain. She demonstrates good baseline balance with advanced balance exercises. The pt will continue to benefit from skilled PT to address her primary impairments and return to her prior level of function with less limitation. ?  ?  ?OBJECTIVE IMPAIRMENTS Abnormal gait, decreased balance, difficulty walking, decreased ROM, decreased strength, impaired flexibility, improper body mechanics, and pain.  ?  ?ACTIVITY LIMITATIONS occupation, yard work, and shopping.  ?  ?PERSONAL FACTORS 3+ comorbidities: anxiety, depression, arthritis  are also affecting patient's functional outcome.  ?  ?  ?REHAB  POTENTIAL: Good ?  ?CLINICAL DECISION MAKING: Stable/uncomplicated ?  ?EVALUATION COMPLEXITY: Low ?  ?  ?GOALS: ?Goals reviewed with patient? Yes ?  ?SHORT TERM GOALS: ?  ?Pt will report understanding and adherence to her HEP in order to promote independence in the management of her primary impairments. ?Baseline: HEP provided at eval ?Target date: 09/21/2021 ?Goal status: INITIAL ?  ?  ?LONG TERM GOALS: ?  ?Pt will achieve a FOTO score of 74% in order to demonstrate improved functional ability as it relates to her primary impairments. ?Baseline: 64% ?Target date: 10/19/2021 ?Goal status: INITIAL ?  ?2.  Pt will report ability to stand/ walk through an entire workday with 0/10 pain in order to perform her work duties  with less distraction. ?Baseline: 2/10 pain at end of workday ?Target date: 10/19/2021 ?Goal status: INITIAL ?  ?3.  Pt will demonstrate 15 WNL SL heel raises with good medial control in order to return to cardio workouts with less imbalance. ?Baseline: pt demonstrates lack of medial control with heel raises, with increased calcaneal eversion ?Target date: 10/19/2021 ?Goal status: INITIAL ?  ?4.  Pt will achieve BIL dorsiflexion AROM of 10 degrees to promote WNL gait during exercise. ?Baseline: 5 degrees BIL ?Target date: 10/19/2021 ?Goal status: INITIAL ?  ?  ?PLAN: ?PT FREQUENCY: 1x/week ?  ?PT DURATION: 8 weeks ?  ?PLANNED INTERVENTIONS: Therapeutic exercises, Therapeutic activity, Neuromuscular re-education, Balance training, Gait training, Patient/Family education, Joint mobilization, Stair training, Dry Needling, Electrical stimulation, Cryotherapy, Moist heat, Taping, Vasopneumatic device, Ionotophoresis '4mg'$ /ml Dexamethasone, and Manual therapy ?  ?PLAN FOR NEXT SESSION: Progress lateral ankle strengthening/ stability exercises ? ? ?Vanessa Wenonah, PT, DPT ?09/06/21 5:42 PM ? ? ?  ?

## 2021-09-06 ENCOUNTER — Ambulatory Visit: Payer: 59

## 2021-09-06 ENCOUNTER — Other Ambulatory Visit: Payer: Self-pay

## 2021-09-06 DIAGNOSIS — R262 Difficulty in walking, not elsewhere classified: Secondary | ICD-10-CM | POA: Diagnosis not present

## 2021-09-06 DIAGNOSIS — M25571 Pain in right ankle and joints of right foot: Secondary | ICD-10-CM

## 2021-09-06 DIAGNOSIS — M6281 Muscle weakness (generalized): Secondary | ICD-10-CM

## 2021-09-13 ENCOUNTER — Encounter: Payer: Self-pay | Admitting: Physical Therapy

## 2021-09-13 ENCOUNTER — Ambulatory Visit: Payer: 59 | Admitting: Physical Therapy

## 2021-09-13 DIAGNOSIS — M6281 Muscle weakness (generalized): Secondary | ICD-10-CM

## 2021-09-13 DIAGNOSIS — R262 Difficulty in walking, not elsewhere classified: Secondary | ICD-10-CM

## 2021-09-13 DIAGNOSIS — M25571 Pain in right ankle and joints of right foot: Secondary | ICD-10-CM | POA: Diagnosis not present

## 2021-09-13 NOTE — Therapy (Addendum)
?PHYSICAL THERAPY UNPLANNED DISCHARGE SUMMARY  ? ?Visits from Start of Care: 4 ? ?Current functional level related to goals / functional outcomes: ?Current status unknown ?  ?Remaining deficits: ?Current status unknown ?  ?Education / Equipment: ?Pt has not returned since visit listed below ? ?Patient goals were not assessed. Patient is being discharged due to not returning since the last visit. ? ?(the below note was addended to include the above D/C summary on 09/28/21) ? ?OUTPATIENT PHYSICAL THERAPY TREATMENT NOTE ? ? ?Patient Name: Sheila Obrien ?MRN: [id number] ?DOB:May 11, 1971, 51 y.o., female ?Today's Date: 09/13/2021 ? ?PCP: Rita Ohara, MD ?REFERRING PROVIDER: Dene Gentry, MD ? ? PT End of Session - 09/13/21 1828   ? ? Visit Number 4   ? Number of Visits 9   ? Date for PT Re-Evaluation 10/19/21   ? Authorization Type MCE   ? Authorization Time Period FOTO v6, v10   ? PT Start Time [phone]   ? PT Stop Time 0710   ? PT Time Calculation (min) 42 min   ? Equipment Utilized During Treatment Gait belt   ? Activity Tolerance Patient tolerated treatment well   ? Behavior During Therapy [facility] for tasks assessed/performed   ? ?  ?  ? ?  ? ? ? ?Past Medical History:  ?Diagnosis Date  ? Anxiety   ? Arthritis   ? Cervical radiculopathy 05/02/2021  ? Depression   ? Eczema   ? Low back pain   ? intermittent problems with SI joint  ? Right carpal tunnel syndrome 05/02/2021  ? Sensorineural hearing loss   ? left ear, since childhood  ? Ureteral reflux   ? s/p surgery  ? ?Past Surgical History:  ?Procedure Laterality Date  ? ANTERIOR CERVICAL DECOMP/DISCECTOMY FUSION N/A 05/19/2021  ? Procedure: cervical four-cervical five anterior cervical decompression and fusion;  Surgeon: Kary Kos, MD;  Location: [place];  Service: Neurosurgery;  Laterality: N/A;  3C  ? bilateral ureteral reimplantation  05/1990  ? Dr.Humphries  ? CESAREAN SECTION  2004  ? ?Patient Active Problem List  ? Diagnosis Date Noted  ? Spondylosis of cervical joint  05/19/2021  ? Peroneal neuropathy 05/12/2019  ? Myelopathy ([place]) 04/11/2019  ? Plantar fasciitis of left foot 09/05/2017  ? Cavus deformity of foot 09/05/2017  ? Vitamin D deficiency 11/06/2016  ? Knee pain 02/19/2012  ? Fibrocystic breast 02/13/2012  ? Eczema 02/13/2012  ? ? ?THERAPY DIAG:  ?Pain in right ankle and joints of right foot ? ?Difficulty in walking, not elsewhere classified ? ?Muscle weakness (generalized) ? ?REFERRING DIAG: M25.571 (ICD-10-CM) - Right ankle pain, unspecified chronicity ? ?PERTINENT HISTORY: Rt lateral ankle sprain with torn peroneus brevis on 06/25/2021 ? ?PRECAUTIONS/RESTRICTIONS:  ? ?none ? ?SUBJECTIVE:  ?Pt reports that her R ankle is doing well.  She actually sprained her L ankle, but this has been minimally painful. ? ?PAIN:  ?Are you having pain? No ?NPRS scale: 0/10 ?Pain location: Rt lateral ankle ?PAIN TYPE: aching and dull ?Pain description: intermittent  ?Aggravating factors: Prolonged walking ?Relieving factors: rest, ice ? ?OBJECTIVE: ? ?MUSCLE LENGTH: ?Gastroc: Moderate tightness BIL ?Soleus: Moderate tightness BIL ?  ?POSTURE:  ?BIL pes cavus, 6 degrees of calcaneal inversion on Lt, 8 degrees of calcaneal inversion on Rt ?  ?PALPATION: ?TTP to Rt peroneal insertion ?  ?LE AROM/PROM: ?  ?A/PROM Right ?08/24/2021 Left ?08/24/2021  ?Ankle dorsiflexion 5/10 5/7  ?Ankle plantarflexion 55/60 55/60  ?Ankle inversion 50/65p! 40/60  ?Ankle eversion 5/20 10/30  ? (  Blank rows = not tested) ?  ?LE MMT: ?  ?MMT Right ?08/24/2021 Left ?08/24/2021  ?Hip flexion 5/5 5/5  ?Hip extension 3/5 3/5  ?Hip abduction 4/5 4+/5  ?Ankle dorsiflexion 5/5 5/5  ?Ankle plantarflexion 5/5 5/5  ?Ankle inversion 5/5 5/5  ?Ankle eversion 4+/5p! 5/5  ? (Blank rows = not tested) ?  ?LOWER EXTREMITY SPECIAL TESTS:  ?Anterior drawer: (-) ?Posterior drawer: (-) ?Medial/ lateral talar tilt: hypermobile medial talar tilt on Rt ?  ?FUNCTIONAL TESTS:  ?DL heel raise x25: Noted calcaneal inversion throughout ?SL heel raise  x15: Noted calcaneal inversion throughout BIL, ?Squat: WNL ? ?HOME EXERCISE PROGRAM: ?Access Code: [email or web address] ?URL: [email or web address] ?Date: 09/13/2021 ?Prepared by: Shearon Balo ? ?Exercises ?- Gastroc Stretch on Wall  - 1 x daily - 7 x weekly - 2 sets - 1-min hold ?- Single Leg Stance  - 2 x daily - 6 x weekly - 1 sets - 3 reps - 60 sec hold ?- Eccentric Heel Lowering on Step  - 1 x daily - 7 x weekly - 3 sets - 10 reps ?- Side Plank with Clam and Resistance  - 1 x daily - 7 x weekly - 3 sets - 10 reps ?- Supine Bridge with Knee Extension and Pelvic Floor Contraction  - 1 x daily - 7 x weekly - 3 sets - 10 reps ? ? ?[facility] Adult PT Treatment:                                                DATE: 09/13/2021 ?Therapeutic Exercise: ?Elliptical 5 min for warm up while taking subjective ?staggered bridge 3x10 ?side plank with clam GTB 3x10 ?slant board stretch - 45'' x3 ?heel raise on step with tennis ball to increase inversion - 15x ? -S/L eccentric lowering ?RDL with slider ?SLS pallof press ? ?[facility] Adult PT Treatment:                                                DATE: 09/06/2021 ?Therapeutic Exercise: ?SLS with heel on edge of Airex while tilting Tidal Tank side to side 3x20 tilts ?Standing slant board gastroc stretch x2 min  ?Forward lunge with foot on each side of Bosu ball and with forward/backward Tidal Tank perturbations 2x10 BIL on each side of Bosu ?Standing curtsy on Airex pad with medial/ lateral Tidal Tank tilting 2x10 BIL ?Bouncing heel raises on Airex pad 3x30 ?Tall-kneeling hip thrust on Airex pad with 23# cable at [employer] machine 2x10; 17# 1x10 ?Squat into false hop heel raise with waist attachment and two 7# cables at [employer] machine 3x10 ?Side steps with 13# cable on waist attachment at [employer] x5 laps BIL ?Manual Therapy: ?N/A ?Neuromuscular re-ed: ?N/A ?Therapeutic Activity: ?N/A ?Modalities: ?N/A ?Self Care: ?N/A ? ? ? ?TREATMENT 3/16: ?Therapeutic Exercise: ?- Recumbent  bike 5' for warm up while taking subjective ?- staggered bridge 3x10 ?- side plank with clam RTB 3x10 ?- rolling bil gastroc ?- slant board stretch - 45'' x3 ?- heel raise on step with tennis ball to increase inversion - 15x ? - S/L eccentric lowering ?-RDL with slider ? ?  ?ASSESSMENT: ?  ?CLINICAL IMPRESSION: ?Donnalyn has met all goals and is prepared for D/C home with  HEP; she agrees to plan. ?  ?  ?OBJECTIVE IMPAIRMENTS Abnormal gait, decreased balance, difficulty walking, decreased ROM, decreased strength, impaired flexibility, improper body mechanics, and pain.  ?  ?ACTIVITY LIMITATIONS occupation, yard work, and shopping.  ?  ?PERSONAL FACTORS 3+ comorbidities: anxiety, depression, arthritis  are also affecting patient's functional outcome.  ?  ?  ?REHAB POTENTIAL: Good ?  ?CLINICAL DECISION MAKING: Stable/uncomplicated ?  ?EVALUATION COMPLEXITY: Low ?  ?  ?GOALS: ?Goals reviewed with patient? Yes ?  ?SHORT TERM GOALS: ?  ?Pt will report understanding and adherence to her HEP in order to promote independence in the management of her primary impairments. ?Baseline: HEP provided at eval ?Target date: 09/21/2021 ?Goal status: MET 3/29 ?  ?  ?LONG TERM GOALS: ?  ?Pt will achieve a FOTO score of 74% in order to demonstrate improved functional ability as it relates to her primary impairments. ?Baseline: 64% ? ?3/31: MET (91%) ? ?Target date: 10/19/2021 ?Goal status: MET ?  ?2.  Pt will report ability to stand/ walk through an entire workday with 0/10 pain in order to perform her work duties with less distraction. ?Baseline: 2/10 pain at end of workday ?Target date: 10/19/2021 ?Goal status: MET 3/29 ?  ?3.  Pt will demonstrate 15 WNL SL heel raises with good medial control in order to return to cardio workouts with less imbalance. ?Baseline: pt demonstrates lack of medial control with heel raises, with increased calcaneal eversion ?Target date: 10/19/2021 ?Goal status: MET 15x ?  ?4.  Pt will achieve BIL dorsiflexion AROM of  10 degrees to promote WNL gait during exercise. ?Baseline: 5 degrees BIL ?Target date: 10/19/2021 ?Goal status: MET 11 degrees ?  ?  ?PLAN: ?PT FREQUENCY: 1x/week ?  ?PT DURATION: 8 weeks ?  ?PLANNED INT

## 2021-09-20 ENCOUNTER — Ambulatory Visit: Payer: 59 | Admitting: Family Medicine

## 2021-09-28 ENCOUNTER — Other Ambulatory Visit (HOSPITAL_COMMUNITY): Payer: Self-pay

## 2021-10-02 ENCOUNTER — Ambulatory Visit: Payer: 59 | Admitting: Family Medicine

## 2021-10-03 ENCOUNTER — Other Ambulatory Visit (HOSPITAL_COMMUNITY): Payer: Self-pay

## 2021-10-13 ENCOUNTER — Other Ambulatory Visit (HOSPITAL_COMMUNITY): Payer: Self-pay

## 2021-10-13 DIAGNOSIS — F3181 Bipolar II disorder: Secondary | ICD-10-CM | POA: Diagnosis not present

## 2021-10-13 DIAGNOSIS — F411 Generalized anxiety disorder: Secondary | ICD-10-CM | POA: Diagnosis not present

## 2021-10-13 DIAGNOSIS — F5081 Binge eating disorder: Secondary | ICD-10-CM | POA: Diagnosis not present

## 2021-10-13 MED ORDER — LAMOTRIGINE 25 MG PO TABS
ORAL_TABLET | ORAL | 0 refills | Status: DC
  Filled 2021-10-13: qty 54, 31d supply, fill #0

## 2021-10-30 ENCOUNTER — Other Ambulatory Visit (HOSPITAL_COMMUNITY): Payer: Self-pay

## 2021-11-14 ENCOUNTER — Other Ambulatory Visit (HOSPITAL_COMMUNITY): Payer: Self-pay

## 2021-11-14 MED ORDER — ARIPIPRAZOLE 5 MG PO TABS
5.0000 mg | ORAL_TABLET | Freq: Every day | ORAL | 2 refills | Status: DC
  Filled 2021-11-14: qty 90, 90d supply, fill #0
  Filled 2021-11-27: qty 30, 30d supply, fill #0

## 2021-11-14 MED ORDER — LAMOTRIGINE 100 MG PO TABS
100.0000 mg | ORAL_TABLET | Freq: Every morning | ORAL | 2 refills | Status: DC
  Filled 2021-11-14: qty 30, 30d supply, fill #0

## 2021-11-14 MED ORDER — ESCITALOPRAM OXALATE 20 MG PO TABS
20.0000 mg | ORAL_TABLET | Freq: Every day | ORAL | 2 refills | Status: DC
  Filled 2021-11-14 – 2021-11-27 (×2): qty 30, 30d supply, fill #0

## 2021-11-14 MED ORDER — BUPROPION HCL ER (XL) 300 MG PO TB24
300.0000 mg | ORAL_TABLET | Freq: Every morning | ORAL | 2 refills | Status: DC
  Filled 2021-11-14: qty 30, 30d supply, fill #0

## 2021-11-27 ENCOUNTER — Other Ambulatory Visit (HOSPITAL_COMMUNITY): Payer: Self-pay

## 2021-11-29 ENCOUNTER — Other Ambulatory Visit (HOSPITAL_COMMUNITY): Payer: Self-pay

## 2021-11-29 DIAGNOSIS — F3181 Bipolar II disorder: Secondary | ICD-10-CM | POA: Diagnosis not present

## 2021-11-29 DIAGNOSIS — F5081 Binge eating disorder: Secondary | ICD-10-CM | POA: Diagnosis not present

## 2021-11-29 DIAGNOSIS — F411 Generalized anxiety disorder: Secondary | ICD-10-CM | POA: Diagnosis not present

## 2021-11-29 DIAGNOSIS — F339 Major depressive disorder, recurrent, unspecified: Secondary | ICD-10-CM | POA: Diagnosis not present

## 2021-11-29 MED ORDER — ESCITALOPRAM OXALATE 20 MG PO TABS
20.0000 mg | ORAL_TABLET | Freq: Every evening | ORAL | 2 refills | Status: DC
  Filled 2021-11-29: qty 30, 30d supply, fill #0

## 2021-11-29 MED ORDER — ARIPIPRAZOLE 5 MG PO TABS
5.0000 mg | ORAL_TABLET | Freq: Every day | ORAL | 2 refills | Status: DC
  Filled 2021-11-29: qty 30, 30d supply, fill #0
  Filled 2022-01-02: qty 90, 90d supply, fill #0

## 2021-11-29 MED ORDER — LAMOTRIGINE 25 MG PO TABS
75.0000 mg | ORAL_TABLET | Freq: Every morning | ORAL | 1 refills | Status: DC
  Filled 2021-11-29: qty 90, 30d supply, fill #0

## 2021-11-29 MED ORDER — BUPROPION HCL ER (XL) 300 MG PO TB24
300.0000 mg | ORAL_TABLET | Freq: Every morning | ORAL | 2 refills | Status: DC
  Filled 2021-11-29: qty 30, 30d supply, fill #0

## 2021-12-15 ENCOUNTER — Other Ambulatory Visit (HOSPITAL_COMMUNITY): Payer: Self-pay

## 2021-12-18 ENCOUNTER — Other Ambulatory Visit (HOSPITAL_COMMUNITY): Payer: Self-pay

## 2021-12-18 DIAGNOSIS — F411 Generalized anxiety disorder: Secondary | ICD-10-CM | POA: Diagnosis not present

## 2021-12-18 DIAGNOSIS — F339 Major depressive disorder, recurrent, unspecified: Secondary | ICD-10-CM | POA: Diagnosis not present

## 2021-12-18 DIAGNOSIS — F3181 Bipolar II disorder: Secondary | ICD-10-CM | POA: Diagnosis not present

## 2021-12-18 MED ORDER — AUVELITY 45-105 MG PO TBCR
EXTENDED_RELEASE_TABLET | ORAL | 1 refills | Status: AC
  Filled 2021-12-18 – 2021-12-20 (×2): qty 60, 30d supply, fill #0

## 2021-12-18 MED ORDER — PANTOPRAZOLE SODIUM 40 MG PO TBEC
40.0000 mg | DELAYED_RELEASE_TABLET | Freq: Every day | ORAL | 2 refills | Status: DC
  Filled 2021-12-18: qty 30, 30d supply, fill #0

## 2021-12-20 ENCOUNTER — Other Ambulatory Visit (HOSPITAL_COMMUNITY): Payer: Self-pay

## 2021-12-20 MED ORDER — LAMOTRIGINE 100 MG PO TABS
100.0000 mg | ORAL_TABLET | Freq: Every morning | ORAL | 2 refills | Status: DC
  Filled 2021-12-20: qty 30, 30d supply, fill #0
  Filled 2022-02-12: qty 30, 30d supply, fill #1

## 2021-12-22 ENCOUNTER — Other Ambulatory Visit (HOSPITAL_COMMUNITY): Payer: Self-pay

## 2021-12-28 ENCOUNTER — Other Ambulatory Visit (HOSPITAL_COMMUNITY): Payer: Self-pay

## 2022-01-02 ENCOUNTER — Other Ambulatory Visit (HOSPITAL_COMMUNITY): Payer: Self-pay

## 2022-01-10 ENCOUNTER — Other Ambulatory Visit (HOSPITAL_COMMUNITY): Payer: Self-pay

## 2022-02-12 ENCOUNTER — Other Ambulatory Visit (HOSPITAL_COMMUNITY): Payer: Self-pay

## 2022-02-13 NOTE — Progress Notes (Unsigned)
No chief complaint on file.   Sheila Obrien is a 51 y.o. female who presents for a complete physical.    She has the following concerns:  She had neck surgery by Dr. Saintclair Halsted in 05/2021  She saw Dr. Barbaraann Barthel earlier in the year with R ankle pain. Diagnosed with peroneus brevis tendon rupture. Some improvement with wearing ASO, and got some PT in Feb/March.  Depression and anxiety: on Lexapro and Wellbutrin for many years, continues to do well on this, without any side effects.  Has tried coming off multiple times in the past, with recurrences (while under the care of Dr. Toy Care).   Last year she had reported some recurrent anxiety, feeling somewhat overwhelmed (working full time, Chiropodist, kids).  ?seeing psych now? Abilify added? UPDATE   Vitamin D deficiency: Low levels noted in the past, 23.7 in 2019, 22 in 2018.  Last level was 38.2 in 01/2020, when using [employer] fueling supplements/bars (no separate vitamins). She doesn't drink milk or eat a lot of cheese, just some almond milk.  H/o constipation and bloating.  Bloating had been intermittent, was worse with use of artificial sweeteners.   She had colonoscopy in 01/2021 (after 2 failed attempts due to poor prep).  There were multiple sessile serrated adenoma/polyps. She reports f/u was recommended in  ?3 or 5 years? She tries to follow a high fiber diet and drink plenty of water.  Denies any blood or mucus in the stool.   Immunization History  Administered Date(s) Administered   Hepatitis B 01/07/1996, 02/12/1996, 11/19/1996   Influenza Split 03/18/2013, 03/12/2014, 03/13/2016   PFIZER(Purple Top)SARS-COV-2 Vaccination 06/06/2019, 06/25/2019, 05/06/2020   Td 06/18/1998   Tdap 04/11/2009, 01/21/2019   Zoster Recombinat (Shingrix) 02/24/2021, 05/10/2021   She gets flu shots annually.  Last Pap smear:  03/2021 Dr. Willis Modena. Normal pap, but +HR HPV. Last mammogram: 07/2021 at [facility] colonoscopy: 01/2021 Dr. Collene Mares, multiple  SSP's.  Repeat (?3-5 years) Last DEXA: never  Dentist: twice yearly  Ophtho: at least once a year, wears contacts and glasses.  She has been told she has borderline glaucoma Exercise:  "nothing"  Lipids: Lab Results  Component Value Date   CHOL 197 02/03/2020   HDL 89 02/03/2020   LDLCALC 99 02/03/2020   TRIG 48 02/03/2020   CHOLHDL 2.2 02/03/2020    PMH, PSH, SH and FH were reviewed and updated      ROS: The patient denies anorexia, fever, vision changes, ear pain, sore throat, chest pain, palpitations, dizziness, syncope, dyspnea on exertion, cough, swelling,diarrhea, abdominal pain, melena, hematochezia, indigestion/heartburn, hematuria, incontinence, dysuria, vaginal discharge, or odor, genital lesions, numbness, tingling, weakness, tremor, suspicious skin lesions, depression, anxiety, abnormal bleeding/bruising, or enlarged lymph nodes.  Chronic hearing loss L ear since childhood (a little more noticeable with mask-wearing). No tinnitus.  No change in the last year Constipation per HPI, currenty improved on [employer]. eczma on hands, wrist, intermittent, r/b TAC Some chronic, mild back pain (SI problems) since age of 51.  Hasn't flared in a long time. Cycles are irregular.  No recent problems with any hot flashes. Some urinary urgency   PHYSICAL EXAM:  There were no vitals taken for this visit.  Wt Readings from Last 3 Encounters:  08/09/21 139 lb (63 kg)  07/12/21 139 lb (63 kg)  06/28/21 137 lb (62.1 kg)    General Appearance:  Alert, cooperative, no distress, appears stated age   Head:  Normocephalic, without obvious abnormality, atraumatic   Eyes:  PERRL, conjunctiva/corneas clear, EOM's intact, fundi benign   Ears:  Normal TM's and external ear canals   Nose:  Not examined (wearing mask due to COVID-19)  Throat:  Not examined (wearing mask due to COVID-19)  Neck:  Supple, no lymphadenopathy; thyroid: no enlargement/ tenderness/nodules; no carotid bruit or JVD    Back:  Spine nontender, no curvature, ROM normal, no CVA tenderness   Lungs:  Clear to auscultation bilaterally without wheezes, rales or ronchi; respirations unlabored   Chest Wall:  No tenderness or deformity. Some prominence of R [place] joint (since childhood, per pt, unchanged)   Heart:  Regular rate and rhythm, S1 and S2 normal, no murmur, rub or gallop   Breast Exam:  Deferred to GYN  Abdomen:  Soft, non-tender, nondistended, normoactive bowel sounds, no masses, no hepatosplenomegaly   Genitalia:  Deferred to GYN        Extremities:  No clubbing, cyanosis or edema.    Pulses:  2+ and symmetric all extremities   Skin:  Skin color, texture, turgor normal, no lesions. Some hyperpigmentation/actinic changes to skin on her legs.  Lymph nodes:  Cervical, supraclavicular, and axillary nodes normal   Neurologic:  Normal strength, sensation and gait; reflexes 2+ and symmetric throughout                              Psych:  Normal mood, affect, hygiene and grooming.      ASSESSMENT/PLAN:  Enter if she had add'l COVID booster. Enter last year's flu shot. Offer flu shot today  +HPV on pap 03/2021 with [facility].  Has she had f/u? Scheduled for October?  GAD-7 and PHQ-9 (on treatment) I think she is now seeing psych   Cbc, c-met, lipids D if cut back on fuelings. TSH if sx  Discussed monthly self breast exams and yearly mammograms; at least 30 minutes of aerobic activity at least 5 days/week, weight-bearing exercise at least 2x/week; proper sunscreen use reviewed; healthy diet, including goals of calcium and vitamin D intake and alcohol recommendations (less than or equal to 1 drink/day) reviewed; regular seatbelt use; changing batteries in smoke detectors.  Immunization recommendations discussed--continue yearly flu shots. Update COVID booster when available in the Fall.  Colonoscopy recommendations reviewed, UTD.  Due for pap and past due for GYN exam, reminded to schedule.  F/u 1  year, sooner prn.

## 2022-02-13 NOTE — Patient Instructions (Incomplete)
  HEALTH MAINTENANCE RECOMMENDATIONS:  It is recommended that you get at least 30 minutes of aerobic exercise at least 5 days/week (for weight loss, you may need as much as 60-90 minutes). This can be any activity that gets your heart rate up. This can be divided in 10-15 minute intervals if needed, but try and build up your endurance at least once a week.  Weight bearing exercise is also recommended twice weekly.  Eat a healthy diet with lots of vegetables, fruits and fiber.  "Colorful" foods have a lot of vitamins (ie green vegetables, tomatoes, red peppers, etc).  Limit sweet tea, regular sodas and alcoholic beverages, all of which has a lot of calories and sugar.  Up to 1 alcoholic drink daily may be beneficial for women (unless trying to lose weight, watch sugars).  Drink a lot of water.  Calcium recommendations are 1200-1500 mg daily (1500 mg for postmenopausal women or women without ovaries), and vitamin D 1000 IU daily.  This should be obtained from diet and/or supplements (vitamins), and calcium should not be taken all at once, but in divided doses.  Monthly self breast exams and yearly mammograms for women over the age of 67 is recommended.  Sunscreen of at least SPF 30 should be used on all sun-exposed parts of the skin when outside between the hours of 10 am and 4 pm (not just when at beach or pool, but even with exercise, golf, tennis, and yard work!)  Use a sunscreen that says "broad spectrum" so it covers both UVA and UVB rays, and make sure to reapply every 1-2 hours.  Remember to change the batteries in your smoke detectors when changing your clock times in the spring and fall. Carbon monoxide detectors are recommended for your home.  Use your seat belt every time you are in a car, and please drive safely and not be distracted with cell phones and texting while driving.  I recommend getting the updated COVID booster when available (in the Fall).  I encourage you to resume  counseling, in addition to seeing the new psychiatrist.  You may want to discuss your perimenopausal symptoms with Dr. Willis Modena and see if perhaps you should consider treatment, if you (and him and the psychiatrist) feel that this may be contributing to your depression).   Try and go to the bathroom more frequently--do not wait for the bladder to feel full and have the urge.  Try cutting back on your caffeine.

## 2022-02-15 ENCOUNTER — Encounter: Payer: Self-pay | Admitting: Family Medicine

## 2022-02-15 ENCOUNTER — Ambulatory Visit (INDEPENDENT_AMBULATORY_CARE_PROVIDER_SITE_OTHER): Payer: 59 | Admitting: Family Medicine

## 2022-02-15 VITALS — BP 100/60 | HR 68 | Ht 63.5 in | Wt 132.2 lb

## 2022-02-15 DIAGNOSIS — F411 Generalized anxiety disorder: Secondary | ICD-10-CM

## 2022-02-15 DIAGNOSIS — E559 Vitamin D deficiency, unspecified: Secondary | ICD-10-CM

## 2022-02-15 DIAGNOSIS — F339 Major depressive disorder, recurrent, unspecified: Secondary | ICD-10-CM

## 2022-02-15 DIAGNOSIS — Z0000 Encounter for general adult medical examination without abnormal findings: Secondary | ICD-10-CM

## 2022-02-15 DIAGNOSIS — K5909 Other constipation: Secondary | ICD-10-CM

## 2022-02-15 DIAGNOSIS — Z5181 Encounter for therapeutic drug level monitoring: Secondary | ICD-10-CM

## 2022-02-15 DIAGNOSIS — R87810 Cervical high risk human papillomavirus (HPV) DNA test positive: Secondary | ICD-10-CM | POA: Diagnosis not present

## 2022-02-15 DIAGNOSIS — F325 Major depressive disorder, single episode, in full remission: Secondary | ICD-10-CM

## 2022-02-16 LAB — COMPREHENSIVE METABOLIC PANEL
ALT: 23 IU/L (ref 0–32)
AST: 19 IU/L (ref 0–40)
Albumin/Globulin Ratio: 2.4 — ABNORMAL HIGH (ref 1.2–2.2)
Albumin: 4.7 g/dL (ref 3.8–4.9)
Alkaline Phosphatase: 53 IU/L (ref 44–121)
BUN/Creatinine Ratio: 23 (ref 9–23)
BUN: 22 mg/dL (ref 6–24)
Bilirubin Total: 0.2 mg/dL (ref 0.0–1.2)
CO2: 26 mmol/L (ref 20–29)
Calcium: 9.7 mg/dL (ref 8.7–10.2)
Chloride: 98 mmol/L (ref 96–106)
Creatinine, Ser: 0.97 mg/dL (ref 0.57–1.00)
Globulin, Total: 2 g/dL (ref 1.5–4.5)
Glucose: 93 mg/dL (ref 70–99)
Potassium: 5.1 mmol/L (ref 3.5–5.2)
Sodium: 138 mmol/L (ref 134–144)
Total Protein: 6.7 g/dL (ref 6.0–8.5)
eGFR: 70 mL/min/{1.73_m2} (ref >59)

## 2022-02-16 LAB — CBC WITH DIFFERENTIAL/PLATELET
Basophils Absolute: 0.1 10*3/uL (ref 0.0–0.2)
Basos: 1 %
EOS (ABSOLUTE): 0.1 10*3/uL (ref 0.0–0.4)
Eos: 1 %
Hematocrit: 41.9 % (ref 34.0–46.6)
Hemoglobin: 14.1 g/dL (ref 11.1–15.9)
Immature Grans (Abs): 0 10*3/uL (ref 0.0–0.1)
Immature Granulocytes: 0 %
Lymphocytes Absolute: 1.1 10*3/uL (ref 0.7–3.1)
Lymphs: 15 %
MCH: 32.6 pg (ref 26.6–33.0)
MCHC: 33.7 g/dL (ref 31.5–35.7)
MCV: 97 fL (ref 79–97)
Monocytes Absolute: 0.6 10*3/uL (ref 0.1–0.9)
Monocytes: 8 %
Neutrophils Absolute: 5.3 10*3/uL (ref 1.4–7.0)
Neutrophils: 75 %
Platelets: 233 10*3/uL (ref 150–450)
RBC: 4.32 x10E6/uL (ref 3.77–5.28)
RDW: 11.6 % — ABNORMAL LOW (ref 11.7–15.4)
WBC: 7.1 10*3/uL (ref 3.4–10.8)

## 2022-02-16 LAB — VITAMIN D 25 HYDROXY (VIT D DEFICIENCY, FRACTURES): Vit D, 25-Hydroxy: 41.1 ng/mL (ref 30.0–100.0)

## 2022-02-16 LAB — TSH: TSH: 1.71 u[IU]/mL (ref 0.450–4.500)

## 2022-02-21 ENCOUNTER — Encounter: Payer: Self-pay | Admitting: Internal Medicine

## 2022-02-22 ENCOUNTER — Other Ambulatory Visit (HOSPITAL_COMMUNITY): Payer: Self-pay

## 2022-02-22 DIAGNOSIS — F411 Generalized anxiety disorder: Secondary | ICD-10-CM | POA: Diagnosis not present

## 2022-02-22 DIAGNOSIS — F341 Dysthymic disorder: Secondary | ICD-10-CM | POA: Diagnosis not present

## 2022-02-22 MED ORDER — BUPROPION HCL ER (SR) 150 MG PO TB12
150.0000 mg | ORAL_TABLET | Freq: Every day | ORAL | 0 refills | Status: DC
  Filled 2022-02-22: qty 7, 7d supply, fill #0

## 2022-02-22 MED ORDER — ESCITALOPRAM OXALATE 5 MG PO TABS
5.0000 mg | ORAL_TABLET | Freq: Every day | ORAL | 0 refills | Status: DC
  Filled 2022-02-22: qty 7, 7d supply, fill #0

## 2022-03-08 ENCOUNTER — Other Ambulatory Visit (HOSPITAL_COMMUNITY): Payer: Self-pay

## 2022-03-08 DIAGNOSIS — F411 Generalized anxiety disorder: Secondary | ICD-10-CM | POA: Diagnosis not present

## 2022-03-08 DIAGNOSIS — F341 Dysthymic disorder: Secondary | ICD-10-CM | POA: Diagnosis not present

## 2022-03-08 MED ORDER — MIRTAZAPINE 30 MG PO TABS
30.0000 mg | ORAL_TABLET | Freq: Every day | ORAL | 0 refills | Status: DC
  Filled 2022-03-08: qty 30, 30d supply, fill #0

## 2022-03-22 ENCOUNTER — Other Ambulatory Visit (HOSPITAL_COMMUNITY): Payer: Self-pay

## 2022-03-22 DIAGNOSIS — F411 Generalized anxiety disorder: Secondary | ICD-10-CM | POA: Diagnosis not present

## 2022-03-22 DIAGNOSIS — F341 Dysthymic disorder: Secondary | ICD-10-CM | POA: Diagnosis not present

## 2022-03-22 MED ORDER — MIRTAZAPINE 45 MG PO TABS
45.0000 mg | ORAL_TABLET | Freq: Every day | ORAL | 0 refills | Status: DC
  Filled 2022-03-22: qty 30, 30d supply, fill #0

## 2022-03-27 ENCOUNTER — Encounter: Payer: Self-pay | Admitting: Internal Medicine

## 2022-04-03 DIAGNOSIS — Z130 Encounter for screening for diseases of the blood and blood-forming organs and certain disorders involving the immune mechanism: Secondary | ICD-10-CM | POA: Diagnosis not present

## 2022-04-03 DIAGNOSIS — R3915 Urgency of urination: Secondary | ICD-10-CM | POA: Diagnosis not present

## 2022-04-03 DIAGNOSIS — Z6823 Body mass index (BMI) 23.0-23.9, adult: Secondary | ICD-10-CM | POA: Diagnosis not present

## 2022-04-03 DIAGNOSIS — Z01419 Encounter for gynecological examination (general) (routine) without abnormal findings: Secondary | ICD-10-CM | POA: Diagnosis not present

## 2022-04-03 DIAGNOSIS — Z1389 Encounter for screening for other disorder: Secondary | ICD-10-CM | POA: Diagnosis not present

## 2022-04-03 DIAGNOSIS — Z8742 Personal history of other diseases of the female genital tract: Secondary | ICD-10-CM | POA: Diagnosis not present

## 2022-04-03 DIAGNOSIS — N951 Menopausal and female climacteric states: Secondary | ICD-10-CM | POA: Diagnosis not present

## 2022-04-04 ENCOUNTER — Other Ambulatory Visit (HOSPITAL_COMMUNITY): Payer: Self-pay

## 2022-04-04 MED ORDER — ESTRADIOL 0.075 MG/24HR TD PTTW
1.0000 | MEDICATED_PATCH | TRANSDERMAL | 1 refills | Status: DC
  Filled 2022-04-04: qty 24, 84d supply, fill #0
  Filled 2022-10-12: qty 24, 84d supply, fill #1

## 2022-04-04 MED ORDER — PROGESTERONE MICRONIZED 100 MG PO CAPS
100.0000 mg | ORAL_CAPSULE | Freq: Every day | ORAL | 1 refills | Status: DC
  Filled 2022-04-04: qty 30, 30d supply, fill #0
  Filled 2022-05-08: qty 30, 30d supply, fill #1
  Filled 2022-09-25: qty 30, 30d supply, fill #2
  Filled 2022-10-22: qty 30, 30d supply, fill #3
  Filled 2022-12-21: qty 30, 30d supply, fill #4

## 2022-04-05 ENCOUNTER — Other Ambulatory Visit (HOSPITAL_COMMUNITY): Payer: Self-pay

## 2022-04-05 DIAGNOSIS — F411 Generalized anxiety disorder: Secondary | ICD-10-CM | POA: Diagnosis not present

## 2022-04-05 DIAGNOSIS — F341 Dysthymic disorder: Secondary | ICD-10-CM | POA: Diagnosis not present

## 2022-04-05 MED ORDER — VENLAFAXINE HCL ER 75 MG PO CP24
ORAL_CAPSULE | ORAL | 0 refills | Status: DC
  Filled 2022-04-05: qty 60, 30d supply, fill #0

## 2022-04-05 MED ORDER — MIRTAZAPINE 30 MG PO TBDP
60.0000 mg | ORAL_TABLET | Freq: Every day | ORAL | 0 refills | Status: DC
  Filled 2022-04-05: qty 30, 15d supply, fill #0

## 2022-04-06 ENCOUNTER — Other Ambulatory Visit (HOSPITAL_COMMUNITY): Payer: Self-pay

## 2022-04-16 ENCOUNTER — Other Ambulatory Visit (HOSPITAL_COMMUNITY): Payer: Self-pay

## 2022-04-18 ENCOUNTER — Other Ambulatory Visit (HOSPITAL_COMMUNITY): Payer: Self-pay

## 2022-04-18 DIAGNOSIS — F411 Generalized anxiety disorder: Secondary | ICD-10-CM | POA: Diagnosis not present

## 2022-04-18 DIAGNOSIS — F341 Dysthymic disorder: Secondary | ICD-10-CM | POA: Diagnosis not present

## 2022-04-18 MED ORDER — MIRTAZAPINE 30 MG PO TABS
60.0000 mg | ORAL_TABLET | Freq: Every day | ORAL | 0 refills | Status: DC
  Filled 2022-04-18: qty 30, 15d supply, fill #0

## 2022-04-18 MED ORDER — BUSPIRONE HCL 10 MG PO TABS
5.0000 mg | ORAL_TABLET | Freq: Two times a day (BID) | ORAL | 0 refills | Status: DC
  Filled 2022-04-18: qty 60, 60d supply, fill #0

## 2022-04-20 ENCOUNTER — Other Ambulatory Visit (HOSPITAL_COMMUNITY): Payer: Self-pay

## 2022-04-21 DIAGNOSIS — F4320 Adjustment disorder, unspecified: Secondary | ICD-10-CM | POA: Diagnosis not present

## 2022-04-23 ENCOUNTER — Other Ambulatory Visit (HOSPITAL_COMMUNITY): Payer: Self-pay

## 2022-04-26 ENCOUNTER — Other Ambulatory Visit (HOSPITAL_COMMUNITY): Payer: Self-pay

## 2022-04-26 MED ORDER — TRINTELLIX 10 MG PO TABS
10.0000 mg | ORAL_TABLET | Freq: Every day | ORAL | 2 refills | Status: DC
  Filled 2022-04-26: qty 30, 30d supply, fill #0

## 2022-05-03 ENCOUNTER — Other Ambulatory Visit (HOSPITAL_COMMUNITY): Payer: Self-pay

## 2022-05-03 MED ORDER — REXULTI 1 MG PO TABS
1.0000 mg | ORAL_TABLET | Freq: Every day | ORAL | 2 refills | Status: DC
  Filled 2022-05-03: qty 30, 30d supply, fill #0
  Filled 2022-07-23: qty 30, 30d supply, fill #1

## 2022-05-05 DIAGNOSIS — F322 Major depressive disorder, single episode, severe without psychotic features: Secondary | ICD-10-CM | POA: Diagnosis not present

## 2022-05-07 ENCOUNTER — Other Ambulatory Visit (HOSPITAL_COMMUNITY): Payer: Self-pay

## 2022-05-07 MED ORDER — MIRTAZAPINE 30 MG PO TABS
30.0000 mg | ORAL_TABLET | Freq: Every day | ORAL | 2 refills | Status: DC
  Filled 2022-05-07: qty 30, 30d supply, fill #0

## 2022-05-08 ENCOUNTER — Other Ambulatory Visit (HOSPITAL_COMMUNITY): Payer: Self-pay

## 2022-05-12 DIAGNOSIS — F322 Major depressive disorder, single episode, severe without psychotic features: Secondary | ICD-10-CM | POA: Diagnosis not present

## 2022-05-17 ENCOUNTER — Other Ambulatory Visit (HOSPITAL_COMMUNITY): Payer: Self-pay

## 2022-05-17 MED ORDER — REXULTI 2 MG PO TABS
2.0000 mg | ORAL_TABLET | Freq: Every day | ORAL | 1 refills | Status: DC
  Filled 2022-05-17: qty 30, 30d supply, fill #0
  Filled 2022-06-25: qty 30, 30d supply, fill #1

## 2022-05-17 MED ORDER — LISDEXAMFETAMINE DIMESYLATE 20 MG PO CAPS
20.0000 mg | ORAL_CAPSULE | Freq: Every morning | ORAL | 0 refills | Status: DC
  Filled 2022-05-17: qty 30, 30d supply, fill #0

## 2022-05-17 MED ORDER — MIRTAZAPINE 15 MG PO TABS
15.0000 mg | ORAL_TABLET | Freq: Every day | ORAL | 2 refills | Status: DC
  Filled 2022-05-17: qty 30, 30d supply, fill #0

## 2022-05-22 DIAGNOSIS — F322 Major depressive disorder, single episode, severe without psychotic features: Secondary | ICD-10-CM | POA: Diagnosis not present

## 2022-06-01 DIAGNOSIS — F322 Major depressive disorder, single episode, severe without psychotic features: Secondary | ICD-10-CM | POA: Diagnosis not present

## 2022-06-07 ENCOUNTER — Other Ambulatory Visit (HOSPITAL_COMMUNITY): Payer: Self-pay

## 2022-06-07 MED ORDER — MIRTAZAPINE 7.5 MG PO TABS
7.5000 mg | ORAL_TABLET | Freq: Every day | ORAL | 0 refills | Status: DC
  Filled 2022-06-07: qty 15, 15d supply, fill #0

## 2022-06-07 MED ORDER — LISDEXAMFETAMINE DIMESYLATE 20 MG PO CAPS
20.0000 mg | ORAL_CAPSULE | Freq: Every morning | ORAL | 0 refills | Status: DC
  Filled 2022-06-15: qty 30, 30d supply, fill #0

## 2022-06-07 MED ORDER — TRINTELLIX 20 MG PO TABS
20.0000 mg | ORAL_TABLET | Freq: Every day | ORAL | 2 refills | Status: DC
  Filled 2022-06-07: qty 30, 30d supply, fill #0
  Filled 2022-07-03: qty 30, 30d supply, fill #1
  Filled 2022-08-03: qty 30, 30d supply, fill #2

## 2022-06-08 DIAGNOSIS — F322 Major depressive disorder, single episode, severe without psychotic features: Secondary | ICD-10-CM | POA: Diagnosis not present

## 2022-06-15 ENCOUNTER — Other Ambulatory Visit (HOSPITAL_COMMUNITY): Payer: Self-pay

## 2022-06-23 DIAGNOSIS — F322 Major depressive disorder, single episode, severe without psychotic features: Secondary | ICD-10-CM | POA: Diagnosis not present

## 2022-06-25 ENCOUNTER — Other Ambulatory Visit (HOSPITAL_COMMUNITY): Payer: Self-pay

## 2022-06-27 ENCOUNTER — Encounter: Payer: Self-pay | Admitting: Internal Medicine

## 2022-06-30 DIAGNOSIS — F322 Major depressive disorder, single episode, severe without psychotic features: Secondary | ICD-10-CM | POA: Diagnosis not present

## 2022-07-03 ENCOUNTER — Other Ambulatory Visit (HOSPITAL_COMMUNITY): Payer: Self-pay

## 2022-07-05 ENCOUNTER — Other Ambulatory Visit (HOSPITAL_COMMUNITY): Payer: Self-pay

## 2022-07-05 MED ORDER — HYDROXYZINE HCL 10 MG PO TABS
10.0000 mg | ORAL_TABLET | Freq: Every day | ORAL | 1 refills | Status: DC
  Filled 2022-07-05: qty 30, 30d supply, fill #0
  Filled 2022-09-25: qty 30, 30d supply, fill #1

## 2022-07-05 MED ORDER — REXULTI 2 MG PO TABS
2.0000 mg | ORAL_TABLET | Freq: Every day | ORAL | 1 refills | Status: DC
  Filled 2022-07-05 – 2022-07-23 (×2): qty 30, 30d supply, fill #0

## 2022-07-05 MED ORDER — PROPRANOLOL HCL 10 MG PO TABS
10.0000 mg | ORAL_TABLET | Freq: Every day | ORAL | 2 refills | Status: DC | PRN
  Filled 2022-07-05: qty 30, 30d supply, fill #0
  Filled 2022-08-06: qty 30, 30d supply, fill #1
  Filled 2022-09-04: qty 30, 30d supply, fill #2

## 2022-07-05 MED ORDER — LISDEXAMFETAMINE DIMESYLATE 20 MG PO CAPS
20.0000 mg | ORAL_CAPSULE | Freq: Every morning | ORAL | 0 refills | Status: DC
  Filled 2022-07-13: qty 30, 30d supply, fill #0

## 2022-07-09 ENCOUNTER — Other Ambulatory Visit (HOSPITAL_COMMUNITY): Payer: Self-pay

## 2022-07-09 DIAGNOSIS — F520 Hypoactive sexual desire disorder: Secondary | ICD-10-CM | POA: Diagnosis not present

## 2022-07-09 DIAGNOSIS — N951 Menopausal and female climacteric states: Secondary | ICD-10-CM | POA: Diagnosis not present

## 2022-07-09 MED ORDER — ESTRADIOL 0.075 MG/24HR TD PTTW
1.0000 | MEDICATED_PATCH | TRANSDERMAL | 4 refills | Status: DC
  Filled 2022-07-09 – 2022-07-27 (×2): qty 8, 28d supply, fill #0
  Filled 2023-01-04: qty 8, 28d supply, fill #1
  Filled 2023-01-30: qty 8, 28d supply, fill #2

## 2022-07-09 MED ORDER — PROGESTERONE MICRONIZED 100 MG PO CAPS
100.0000 mg | ORAL_CAPSULE | Freq: Every day | ORAL | 4 refills | Status: DC
  Filled 2022-07-09: qty 90, 90d supply, fill #0
  Filled 2022-07-27: qty 30, 30d supply, fill #0
  Filled 2022-08-27: qty 30, 30d supply, fill #1
  Filled 2022-11-19: qty 30, 30d supply, fill #2

## 2022-07-13 ENCOUNTER — Other Ambulatory Visit (HOSPITAL_COMMUNITY): Payer: Self-pay

## 2022-07-14 DIAGNOSIS — F322 Major depressive disorder, single episode, severe without psychotic features: Secondary | ICD-10-CM | POA: Diagnosis not present

## 2022-07-19 ENCOUNTER — Other Ambulatory Visit (HOSPITAL_COMMUNITY): Payer: Self-pay

## 2022-07-20 ENCOUNTER — Other Ambulatory Visit (HOSPITAL_COMMUNITY): Payer: Self-pay

## 2022-07-21 DIAGNOSIS — F322 Major depressive disorder, single episode, severe without psychotic features: Secondary | ICD-10-CM | POA: Diagnosis not present

## 2022-07-23 ENCOUNTER — Other Ambulatory Visit (HOSPITAL_COMMUNITY): Payer: Self-pay

## 2022-07-27 ENCOUNTER — Other Ambulatory Visit (HOSPITAL_COMMUNITY): Payer: Self-pay

## 2022-07-27 MED ORDER — VRAYLAR 1.5 MG PO CAPS
1.5000 mg | ORAL_CAPSULE | Freq: Every day | ORAL | 2 refills | Status: DC
  Filled 2022-07-27: qty 30, 30d supply, fill #0

## 2022-07-27 MED ORDER — LISDEXAMFETAMINE DIMESYLATE 30 MG PO CAPS
30.0000 mg | ORAL_CAPSULE | Freq: Every morning | ORAL | 0 refills | Status: DC
  Filled 2022-07-27: qty 30, 30d supply, fill #0

## 2022-07-30 ENCOUNTER — Other Ambulatory Visit (HOSPITAL_COMMUNITY): Payer: Self-pay

## 2022-07-30 MED ORDER — REXULTI 1 MG PO TABS
1.0000 mg | ORAL_TABLET | Freq: Every day | ORAL | 0 refills | Status: DC
  Filled 2022-07-30: qty 7, 7d supply, fill #0

## 2022-07-31 ENCOUNTER — Other Ambulatory Visit (HOSPITAL_COMMUNITY): Payer: Self-pay

## 2022-08-03 ENCOUNTER — Other Ambulatory Visit (HOSPITAL_COMMUNITY): Payer: Self-pay

## 2022-08-04 DIAGNOSIS — F322 Major depressive disorder, single episode, severe without psychotic features: Secondary | ICD-10-CM | POA: Diagnosis not present

## 2022-08-06 ENCOUNTER — Other Ambulatory Visit (HOSPITAL_COMMUNITY): Payer: Self-pay

## 2022-08-07 ENCOUNTER — Other Ambulatory Visit (HOSPITAL_COMMUNITY): Payer: Self-pay

## 2022-08-07 DIAGNOSIS — Z1231 Encounter for screening mammogram for malignant neoplasm of breast: Secondary | ICD-10-CM | POA: Diagnosis not present

## 2022-08-09 ENCOUNTER — Other Ambulatory Visit (HOSPITAL_COMMUNITY): Payer: Self-pay

## 2022-08-09 ENCOUNTER — Other Ambulatory Visit: Payer: Self-pay

## 2022-08-09 MED ORDER — ESTRADIOL 0.075 MG/24HR TD PTWK
0.0750 mg | MEDICATED_PATCH | TRANSDERMAL | 4 refills | Status: DC
  Filled 2022-08-09: qty 4, 28d supply, fill #0
  Filled 2022-09-17: qty 4, 28d supply, fill #1

## 2022-08-15 DIAGNOSIS — R928 Other abnormal and inconclusive findings on diagnostic imaging of breast: Secondary | ICD-10-CM | POA: Diagnosis not present

## 2022-08-15 DIAGNOSIS — R922 Inconclusive mammogram: Secondary | ICD-10-CM | POA: Diagnosis not present

## 2022-08-15 LAB — HM MAMMOGRAPHY

## 2022-08-20 ENCOUNTER — Other Ambulatory Visit (HOSPITAL_COMMUNITY): Payer: Self-pay

## 2022-08-20 ENCOUNTER — Encounter: Payer: Self-pay | Admitting: *Deleted

## 2022-08-20 MED ORDER — LISDEXAMFETAMINE DIMESYLATE 30 MG PO CAPS
30.0000 mg | ORAL_CAPSULE | Freq: Every day | ORAL | 0 refills | Status: DC
  Filled 2022-08-29: qty 30, 30d supply, fill #0

## 2022-08-20 MED ORDER — CLONAZEPAM 1 MG PO TABS
1.0000 mg | ORAL_TABLET | Freq: Every day | ORAL | 0 refills | Status: DC | PRN
  Filled 2022-08-20: qty 30, 30d supply, fill #0

## 2022-08-20 MED ORDER — REXULTI 0.5 MG PO TABS
1.0000 | ORAL_TABLET | Freq: Every day | ORAL | 2 refills | Status: DC
  Filled 2022-08-20: qty 30, 30d supply, fill #0
  Filled 2022-09-18: qty 30, 30d supply, fill #1

## 2022-08-20 MED ORDER — TRINTELLIX 20 MG PO TABS
20.0000 mg | ORAL_TABLET | Freq: Every day | ORAL | 2 refills | Status: DC
  Filled 2022-08-20 – 2022-09-03 (×2): qty 30, 30d supply, fill #0

## 2022-08-27 ENCOUNTER — Other Ambulatory Visit (HOSPITAL_COMMUNITY): Payer: Self-pay

## 2022-08-29 ENCOUNTER — Other Ambulatory Visit (HOSPITAL_COMMUNITY): Payer: Self-pay

## 2022-09-03 ENCOUNTER — Other Ambulatory Visit (HOSPITAL_COMMUNITY): Payer: Self-pay

## 2022-09-04 ENCOUNTER — Other Ambulatory Visit (HOSPITAL_COMMUNITY): Payer: Self-pay

## 2022-09-08 DIAGNOSIS — F322 Major depressive disorder, single episode, severe without psychotic features: Secondary | ICD-10-CM | POA: Diagnosis not present

## 2022-09-10 ENCOUNTER — Other Ambulatory Visit (HOSPITAL_COMMUNITY): Payer: Self-pay

## 2022-09-10 MED ORDER — LISDEXAMFETAMINE DIMESYLATE 30 MG PO CHEW
30.0000 mg | CHEWABLE_TABLET | Freq: Every morning | ORAL | 0 refills | Status: DC
  Filled 2022-09-10: qty 30, 30d supply, fill #0

## 2022-09-13 ENCOUNTER — Other Ambulatory Visit (HOSPITAL_COMMUNITY): Payer: Self-pay

## 2022-09-13 MED ORDER — LISDEXAMFETAMINE DIMESYLATE 30 MG PO CAPS
30.0000 mg | ORAL_CAPSULE | Freq: Every morning | ORAL | 0 refills | Status: DC
  Filled 2022-10-10: qty 30, 30d supply, fill #0

## 2022-09-13 MED ORDER — TRINTELLIX 10 MG PO TABS
10.0000 mg | ORAL_TABLET | Freq: Every day | ORAL | 2 refills | Status: DC
  Filled 2022-09-13: qty 30, 30d supply, fill #0
  Filled 2022-10-22: qty 30, 30d supply, fill #1
  Filled 2022-12-21: qty 30, 30d supply, fill #2

## 2022-09-17 ENCOUNTER — Other Ambulatory Visit (HOSPITAL_COMMUNITY): Payer: Self-pay

## 2022-09-18 ENCOUNTER — Other Ambulatory Visit (HOSPITAL_COMMUNITY): Payer: Self-pay

## 2022-09-25 ENCOUNTER — Other Ambulatory Visit (HOSPITAL_COMMUNITY): Payer: Self-pay

## 2022-09-29 DIAGNOSIS — F322 Major depressive disorder, single episode, severe without psychotic features: Secondary | ICD-10-CM | POA: Diagnosis not present

## 2022-10-08 ENCOUNTER — Other Ambulatory Visit (HOSPITAL_COMMUNITY): Payer: Self-pay

## 2022-10-08 MED ORDER — PROPRANOLOL HCL 10 MG PO TABS
10.0000 mg | ORAL_TABLET | Freq: Every day | ORAL | 2 refills | Status: DC | PRN
  Filled 2022-10-08: qty 60, 30d supply, fill #0

## 2022-10-09 ENCOUNTER — Other Ambulatory Visit (HOSPITAL_COMMUNITY): Payer: Self-pay

## 2022-10-10 ENCOUNTER — Other Ambulatory Visit (HOSPITAL_COMMUNITY): Payer: Self-pay

## 2022-10-12 ENCOUNTER — Other Ambulatory Visit (HOSPITAL_COMMUNITY): Payer: Self-pay

## 2022-10-12 MED ORDER — AUVELITY 45-105 MG PO TBCR
1.0000 | EXTENDED_RELEASE_TABLET | Freq: Every day | ORAL | 2 refills | Status: DC
  Filled 2022-10-12: qty 30, 30d supply, fill #0
  Filled 2022-11-13: qty 30, 30d supply, fill #1

## 2022-10-18 ENCOUNTER — Other Ambulatory Visit (HOSPITAL_COMMUNITY): Payer: Self-pay

## 2022-10-18 MED ORDER — PROPRANOLOL HCL 20 MG PO TABS
20.0000 mg | ORAL_TABLET | Freq: Every day | ORAL | 2 refills | Status: DC | PRN
  Filled 2022-10-18: qty 30, 30d supply, fill #0

## 2022-10-18 MED ORDER — REXULTI 1 MG PO TABS
1.0000 mg | ORAL_TABLET | Freq: Every day | ORAL | 2 refills | Status: DC
  Filled 2022-10-18: qty 30, 30d supply, fill #0

## 2022-10-18 MED ORDER — CLONAZEPAM 1 MG PO TABS
1.0000 mg | ORAL_TABLET | Freq: Every day | ORAL | 2 refills | Status: DC | PRN
  Filled 2022-10-18: qty 30, 30d supply, fill #0
  Filled 2022-12-12: qty 30, 30d supply, fill #1
  Filled 2023-01-31: qty 30, 30d supply, fill #2

## 2022-10-18 MED ORDER — LISDEXAMFETAMINE DIMESYLATE 30 MG PO CAPS
30.0000 mg | ORAL_CAPSULE | Freq: Every morning | ORAL | 0 refills | Status: DC
  Filled 2022-10-18 – 2022-11-07 (×2): qty 30, 30d supply, fill #0

## 2022-10-19 ENCOUNTER — Other Ambulatory Visit: Payer: Self-pay

## 2022-10-19 ENCOUNTER — Telehealth: Payer: Self-pay

## 2022-10-19 DIAGNOSIS — F339 Major depressive disorder, recurrent, unspecified: Secondary | ICD-10-CM

## 2022-10-19 DIAGNOSIS — R5383 Other fatigue: Secondary | ICD-10-CM

## 2022-10-19 NOTE — Telephone Encounter (Signed)
Advise pt--thyroid was normal in August.  She sees GYN (Dr. Jackelyn Knife), who prescribes her hormones. He would be the one to check any hormones, if appropriate.  Usually the psychiatrist can order labs (if a psychiatrist, not therapist), which they can do at a lab; psych may not be comfortable with hormone testing, she should talk to Dr. Jackelyn Knife about that.  Can offer just TSH through our office, if needed. She is welcome to schedule a visit with me to discuss as well

## 2022-10-19 NOTE — Progress Notes (Signed)
Ok per Dr. Lynelle Doctor.

## 2022-10-19 NOTE — Telephone Encounter (Signed)
Done

## 2022-10-19 NOTE — Telephone Encounter (Signed)
Pt advised. She stated she was aware that her thyroid was checked in August and doesn't feel that it is out of balance now. She declined scheduling an appt with you as she doesn't feel this is necessary. She would like to proceed with getting her TSH lab completed here.

## 2022-10-19 NOTE — Telephone Encounter (Signed)
Pt advised she has been having a lot depression and anxiety over the last few months. Her psychiatrist wants her hormones and thyroid checked. She is on estrogen and progesterone packs but she has never had these levels checked.

## 2022-10-22 ENCOUNTER — Other Ambulatory Visit (HOSPITAL_COMMUNITY): Payer: Self-pay

## 2022-10-23 ENCOUNTER — Other Ambulatory Visit (HOSPITAL_COMMUNITY): Payer: Self-pay

## 2022-10-25 ENCOUNTER — Other Ambulatory Visit: Payer: 59

## 2022-10-25 DIAGNOSIS — F339 Major depressive disorder, recurrent, unspecified: Secondary | ICD-10-CM | POA: Diagnosis not present

## 2022-10-25 DIAGNOSIS — F418 Other specified anxiety disorders: Secondary | ICD-10-CM | POA: Diagnosis not present

## 2022-10-25 DIAGNOSIS — R5383 Other fatigue: Secondary | ICD-10-CM | POA: Diagnosis not present

## 2022-10-26 LAB — TSH: TSH: 2.08 u[IU]/mL (ref 0.450–4.500)

## 2022-11-03 DIAGNOSIS — F322 Major depressive disorder, single episode, severe without psychotic features: Secondary | ICD-10-CM | POA: Diagnosis not present

## 2022-11-07 ENCOUNTER — Other Ambulatory Visit (HOSPITAL_COMMUNITY): Payer: Self-pay

## 2022-11-13 ENCOUNTER — Other Ambulatory Visit (HOSPITAL_COMMUNITY): Payer: Self-pay

## 2022-11-13 MED ORDER — TRINTELLIX 10 MG PO TABS
10.0000 mg | ORAL_TABLET | Freq: Every day | ORAL | 2 refills | Status: DC
  Filled 2022-11-13: qty 30, 30d supply, fill #0

## 2022-11-13 MED ORDER — LISDEXAMFETAMINE DIMESYLATE 30 MG PO CAPS
30.0000 mg | ORAL_CAPSULE | Freq: Every morning | ORAL | 0 refills | Status: DC
  Filled 2022-12-07: qty 30, 30d supply, fill #0

## 2022-11-13 MED ORDER — REXULTI 2 MG PO TABS
2.0000 mg | ORAL_TABLET | Freq: Every day | ORAL | 2 refills | Status: DC
  Filled 2022-11-13: qty 30, 30d supply, fill #0
  Filled 2022-12-07: qty 30, 30d supply, fill #1

## 2022-11-13 MED ORDER — AUVELITY 45-105 MG PO TBCR
1.0000 | EXTENDED_RELEASE_TABLET | Freq: Two times a day (BID) | ORAL | 2 refills | Status: DC
  Filled 2022-11-13 – 2023-05-15 (×2): qty 60, 30d supply, fill #0

## 2022-11-15 ENCOUNTER — Other Ambulatory Visit (HOSPITAL_COMMUNITY): Payer: Self-pay

## 2022-11-19 ENCOUNTER — Other Ambulatory Visit (HOSPITAL_COMMUNITY): Payer: Self-pay

## 2022-11-22 ENCOUNTER — Other Ambulatory Visit (HOSPITAL_COMMUNITY): Payer: Self-pay

## 2022-11-22 MED ORDER — AUVELITY 45-105 MG PO TBCR
1.0000 | EXTENDED_RELEASE_TABLET | Freq: Two times a day (BID) | ORAL | 2 refills | Status: DC
  Filled 2022-11-22 – 2022-11-28 (×2): qty 60, 30d supply, fill #0

## 2022-11-28 ENCOUNTER — Other Ambulatory Visit (HOSPITAL_COMMUNITY): Payer: Self-pay

## 2022-12-05 ENCOUNTER — Other Ambulatory Visit (HOSPITAL_COMMUNITY): Payer: Self-pay

## 2022-12-07 ENCOUNTER — Other Ambulatory Visit (HOSPITAL_COMMUNITY): Payer: Self-pay

## 2022-12-07 MED ORDER — LISDEXAMFETAMINE DIMESYLATE 40 MG PO CAPS
40.0000 mg | ORAL_CAPSULE | Freq: Every morning | ORAL | 0 refills | Status: DC
  Filled 2022-12-07 – 2023-01-07 (×2): qty 30, 30d supply, fill #0

## 2022-12-12 ENCOUNTER — Other Ambulatory Visit (HOSPITAL_COMMUNITY): Payer: Self-pay

## 2022-12-21 ENCOUNTER — Other Ambulatory Visit (HOSPITAL_COMMUNITY): Payer: Self-pay

## 2023-01-04 ENCOUNTER — Other Ambulatory Visit (HOSPITAL_COMMUNITY): Payer: Self-pay

## 2023-01-04 MED ORDER — LISDEXAMFETAMINE DIMESYLATE 40 MG PO CAPS
40.0000 mg | ORAL_CAPSULE | Freq: Every morning | ORAL | 0 refills | Status: DC
Start: 2023-01-04 — End: 2023-01-04
  Filled 2023-01-04: qty 30, 30d supply, fill #0

## 2023-01-04 MED ORDER — REXULTI 3 MG PO TABS
3.0000 mg | ORAL_TABLET | Freq: Every day | ORAL | 2 refills | Status: DC
  Filled 2023-01-04: qty 30, 30d supply, fill #0
  Filled 2023-01-30: qty 30, 30d supply, fill #1

## 2023-01-04 MED ORDER — TRINTELLIX 20 MG PO TABS
20.0000 mg | ORAL_TABLET | Freq: Every day | ORAL | 2 refills | Status: DC
  Filled 2023-01-04: qty 30, 30d supply, fill #0
  Filled 2023-01-30: qty 30, 30d supply, fill #1

## 2023-01-07 ENCOUNTER — Other Ambulatory Visit (HOSPITAL_COMMUNITY): Payer: Self-pay

## 2023-01-17 ENCOUNTER — Other Ambulatory Visit (HOSPITAL_COMMUNITY): Payer: Self-pay

## 2023-01-17 MED ORDER — ESTRADIOL 0.0375 MG/24HR TD PTTW
1.0000 | MEDICATED_PATCH | TRANSDERMAL | 3 refills | Status: DC
Start: 2023-01-17 — End: 2023-03-07
  Filled 2023-01-17: qty 8, 28d supply, fill #0

## 2023-01-17 MED ORDER — PROGESTERONE 200 MG PO CAPS
200.0000 mg | ORAL_CAPSULE | Freq: Every day | ORAL | 3 refills | Status: DC
Start: 2023-01-17 — End: 2023-05-11
  Filled 2023-01-17: qty 30, 30d supply, fill #0
  Filled 2023-02-14: qty 30, 30d supply, fill #1
  Filled 2023-03-18: qty 30, 30d supply, fill #2
  Filled 2023-04-12: qty 30, 30d supply, fill #3

## 2023-01-22 ENCOUNTER — Other Ambulatory Visit (HOSPITAL_COMMUNITY): Payer: Self-pay

## 2023-01-22 MED ORDER — PROPRANOLOL HCL 20 MG PO TABS
20.0000 mg | ORAL_TABLET | Freq: Every day | ORAL | 2 refills | Status: DC | PRN
  Filled 2023-01-22: qty 30, 30d supply, fill #0
  Filled 2023-02-27: qty 30, 30d supply, fill #1

## 2023-01-23 ENCOUNTER — Other Ambulatory Visit (HOSPITAL_COMMUNITY): Payer: Self-pay

## 2023-01-30 ENCOUNTER — Other Ambulatory Visit (HOSPITAL_COMMUNITY): Payer: Self-pay

## 2023-01-31 ENCOUNTER — Other Ambulatory Visit (HOSPITAL_COMMUNITY): Payer: Self-pay

## 2023-02-04 ENCOUNTER — Other Ambulatory Visit (HOSPITAL_COMMUNITY): Payer: Self-pay

## 2023-02-04 DIAGNOSIS — H524 Presbyopia: Secondary | ICD-10-CM | POA: Diagnosis not present

## 2023-02-04 DIAGNOSIS — H40013 Open angle with borderline findings, low risk, bilateral: Secondary | ICD-10-CM | POA: Diagnosis not present

## 2023-02-04 MED ORDER — LISDEXAMFETAMINE DIMESYLATE 40 MG PO CAPS
40.0000 mg | ORAL_CAPSULE | Freq: Every morning | ORAL | 0 refills | Status: DC
Start: 2023-02-04 — End: 2023-03-04
  Filled 2023-02-04: qty 30, 30d supply, fill #0

## 2023-02-06 ENCOUNTER — Other Ambulatory Visit (HOSPITAL_COMMUNITY): Payer: Self-pay

## 2023-02-06 MED ORDER — CLONAZEPAM 1 MG PO TABS
ORAL_TABLET | ORAL | 2 refills | Status: DC
Start: 2023-02-06 — End: 2023-03-07

## 2023-02-07 ENCOUNTER — Other Ambulatory Visit (HOSPITAL_COMMUNITY): Payer: Self-pay

## 2023-02-07 MED ORDER — AUVELITY 45-105 MG PO TBCR
1.0000 | EXTENDED_RELEASE_TABLET | Freq: Every morning | ORAL | 2 refills | Status: DC
  Filled 2023-02-07: qty 30, 30d supply, fill #0
  Filled 2023-03-18: qty 30, 30d supply, fill #1
  Filled 2023-04-12: qty 30, 30d supply, fill #2

## 2023-02-14 ENCOUNTER — Other Ambulatory Visit (HOSPITAL_COMMUNITY): Payer: Self-pay

## 2023-02-17 DIAGNOSIS — F321 Major depressive disorder, single episode, moderate: Secondary | ICD-10-CM | POA: Diagnosis not present

## 2023-02-27 ENCOUNTER — Other Ambulatory Visit (HOSPITAL_COMMUNITY): Payer: Self-pay

## 2023-02-28 ENCOUNTER — Other Ambulatory Visit (HOSPITAL_COMMUNITY): Payer: Self-pay

## 2023-02-28 ENCOUNTER — Other Ambulatory Visit: Payer: Self-pay

## 2023-02-28 MED ORDER — REXULTI 2 MG PO TABS
2.0000 mg | ORAL_TABLET | Freq: Every day | ORAL | 0 refills | Status: DC
  Filled 2023-02-28: qty 30, 30d supply, fill #0

## 2023-02-28 MED ORDER — CLONAZEPAM 1 MG PO TABS
1.0000 mg | ORAL_TABLET | Freq: Two times a day (BID) | ORAL | 2 refills | Status: DC
Start: 2023-02-28 — End: 2024-03-16
  Filled 2023-02-28: qty 60, 30d supply, fill #0
  Filled 2023-05-14: qty 60, 30d supply, fill #1
  Filled 2023-07-10: qty 60, 30d supply, fill #2

## 2023-03-04 ENCOUNTER — Other Ambulatory Visit (HOSPITAL_COMMUNITY): Payer: Self-pay

## 2023-03-04 MED ORDER — TRINTELLIX 20 MG PO TABS
20.0000 mg | ORAL_TABLET | Freq: Every day | ORAL | 2 refills | Status: DC
  Filled 2023-03-04: qty 30, 30d supply, fill #0
  Filled 2023-04-03: qty 30, 30d supply, fill #1
  Filled 2023-05-02: qty 30, 30d supply, fill #2

## 2023-03-04 MED ORDER — LISDEXAMFETAMINE DIMESYLATE 40 MG PO CAPS
40.0000 mg | ORAL_CAPSULE | Freq: Every morning | ORAL | 0 refills | Status: DC
Start: 2023-03-06 — End: 2024-03-16
  Filled 2023-03-06: qty 30, 30d supply, fill #0

## 2023-03-05 ENCOUNTER — Other Ambulatory Visit (HOSPITAL_COMMUNITY): Payer: Self-pay

## 2023-03-06 ENCOUNTER — Other Ambulatory Visit (HOSPITAL_COMMUNITY): Payer: Self-pay

## 2023-03-06 ENCOUNTER — Other Ambulatory Visit: Payer: Self-pay

## 2023-03-06 NOTE — Patient Instructions (Incomplete)
  HEALTH MAINTENANCE RECOMMENDATIONS:  It is recommended that you get at least 30 minutes of aerobic exercise at least 5 days/week (for weight loss, you may need as much as 60-90 minutes). This can be any activity that gets your heart rate up. This can be divided in 10-15 minute intervals if needed, but try and build up your endurance at least once a week.  Weight bearing exercise is also recommended twice weekly.  Eat a healthy diet with lots of vegetables, fruits and fiber.  "Colorful" foods have a lot of vitamins (ie green vegetables, tomatoes, red peppers, etc).  Limit sweet tea, regular sodas and alcoholic beverages, all of which has a lot of calories and sugar.  Up to 1 alcoholic drink daily may be beneficial for women (unless trying to lose weight, watch sugars).  Drink a lot of water.  Calcium recommendations are 1200-1500 mg daily (1500 mg for postmenopausal women or women without ovaries), and vitamin D 1000 IU daily.  This should be obtained from diet and/or supplements (vitamins), and calcium should not be taken all at once, but in divided doses.  Monthly self breast exams and yearly mammograms for women over the age of 9 is recommended.  Sunscreen of at least SPF 30 should be used on all sun-exposed parts of the skin when outside between the hours of 10 am and 4 pm (not just when at beach or pool, but even with exercise, golf, tennis, and yard work!)  Use a sunscreen that says "broad spectrum" so it covers both UVA and UVB rays, and make sure to reapply every 1-2 hours.  Remember to change the batteries in your smoke detectors when changing your clock times in the spring and fall. Carbon monoxide detectors are recommended for your home.  Use your seat belt every time you are in a car, and please drive safely and not be distracted with cell phones and texting while driving.  Next colonoscopy should be due in 01/2024. Contact Dr. Loreta Ave if you don't hear from their office around that  time.  We gave you your COVID booster today. Ideally you should wait up to a couple of weeks before getting the flu shot (not critical).

## 2023-03-06 NOTE — Progress Notes (Unsigned)
No chief complaint on file.   Sheila Obrien is a 52 y.o. female who presents for a complete physical.    She has the following concerns:  Depression and anxiety:   She called the office in May stating she had a few months of worsening depression and anxiety, her psychiatrist wanting her to have hormones and thyroid checked.  She had normal TSH. Deferred hormone evaluation to her GYN (who prescribes her HRT).  Her history is that she had been on Lexapro and Wellbutrin for many years, under Dr. Evelene Croon, with recurrences when she tried to come off medication. 2 years ago she had reported some recurrent anxiety, feeling somewhat overwhelmed (working full time, Radio producer, kids). She started seeing Mortimer Fries at [facility], was put on Abilify in Sept/October, which helped with the anxiety.  In 08/2021 her depression worsened, and didn't improve in the summer as usual.  Mardella Layman added Lamictal in May 2023, took 100mg . She had trouble reaching [place], so was switching to see Dr. Randa Evens.  Previously saw Baruch Goldmann for counseling, in 2021, started seeing her again in 04/2022.    Vitamin D deficiency: Last level was normal at 41.1 in 01/2022. This was when she was having 5 Optavia fuelings daily, no separate D3.   She is currently taking    Component Ref Range & Units 1 yr ago 3 yr ago 4 yr ago 5 yr ago 6 yr ago 11 yr ago  Vit D, 25-Hydroxy 30.0 - 100.0 ng/mL 41.1 38.2 CM 39.2 CM 23.7 Low  CM 22 Low  R, CM 51 R, CM    H/o constipation and bloating.  Bloating had been intermittent, was worse with use of artificial sweeteners.   She had colonoscopy in 01/2021 (after 2 failed attempts due to poor prep).  There were multiple sessile serrated adenoma/polyps. She reports f/u was recommended in 3 years. She tries to follow a high fiber diet and drink plenty of water.  Denies any blood or mucus in the stool. She has been using Miralax regularly to treat her constipation.  Doesn't get  bloating or cramping when her bowels are moving regularly.  Hoarseness:  She noted this last year, mentioned to psych and was given a trial of PPI.  She didn't notice much difference (was treating for possible silent reflux). She doesn't have any heartburn.  Hoarseness is intermittent. She denies any symptoms related to allergies--no runny nose, sneezing, postnasal drainage, throat-clearing.  Immunization History  Administered Date(s) Administered   Hepatitis B 01/07/1996, 02/12/1996, 11/19/1996   Influenza Split 03/18/2013, 03/12/2014, 03/13/2016   Influenza-Unspecified 03/18/2021, 04/09/2022   PFIZER(Purple Top)SARS-COV-2 Vaccination 06/06/2019, 06/25/2019, 05/06/2020   Td 06/18/1998   Tdap 04/11/2009, 01/21/2019   Zoster Recombinant(Shingrix) 02/24/2021, 05/10/2021   She gets flu shots annually.  Last Pap smear:  03/2021 Dr. Jackelyn Knife. Normal pap, but +HR HPV. Sees him yearly in October. Last mammogram: 07/2022 at [facility] (required diagnostic on L, normal) Last colonoscopy: 01/2021 Dr. Loreta Ave, multiple SSP's.  Repeat rec in 3 years Last DEXA: never  Dentist: twice yearly  Ophtho: at least once a year, wears contacts and glasses.  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  Exercise:   walking 3 miles every morning for the last month, planks, some elliptical, squats. No other weight-bearing exercise.   Lipids: Lab Results  Component Value Date   CHOL 197 02/03/2020   HDL 89 02/03/2020   LDLCALC 99 02/03/2020   TRIG 48 02/03/2020   CHOLHDL 2.2  02/03/2020    PMH, PSH, SH and FH were reviewed and updated     ROS: The patient denies anorexia, fever, vision changes, ear pain, sore throat, chest pain, palpitations, dizziness, syncope, dyspnea on exertion, cough, swelling, diarrhea, abdominal pain, melena, hematochezia, indigestion/heartburn, hematuria, incontinence, dysuria, vaginal discharge, or odor, genital lesions, numbness, tingling, weakness, tremor, suspicious skin lesions,  depression, anxiety, abnormal bleeding/bruising, or enlarged lymph nodes.  Chronic hearing loss L ear since childhood . Denies any change or tinnitus. eczma on hands, intermittent, r/b TAC.  Some chronic, mild back pain (SI problems) since age of 87.  Hasn't flared in a long time.  On HRT--denies vaginal bleeding, vaginal discharge.  No hot flashes or night sweats.  Some urinary urgency. Denies frequency, dysuria, hematuria. Hoarseness ?? Constipation is controlled with regular Miralax use    PHYSICAL EXAM:  There were no vitals taken for this visit.  Wt Readings from Last 3 Encounters:  02/15/22 132 lb 3.2 oz (60 kg)  08/09/21 139 lb (63 kg)  07/12/21 139 lb (63 kg)    General Appearance:  Alert, cooperative, no distress, appears stated age   Head:  Normocephalic, without obvious abnormality, atraumatic   Eyes:  PERRL, conjunctiva/corneas clear, EOM's intact, fundi benign   Ears:  Normal TM's and external ear canals   Nose:  No drainage or sinus tenderness  Throat:  Normal, no lesions  Neck:  Supple, no lymphadenopathy; thyroid: no enlargement/ tenderness/nodules; no carotid bruit or JVD. WHSS right anterior neck  Back:  Spine nontender, no curvature, ROM normal, no CVA tenderness   Lungs:  Clear to auscultation bilaterally without wheezes, rales or ronchi; respirations unlabored   Chest Wall:  No tenderness or deformity. Some prominence of R Gaylord joint (since childhood, per pt, unchanged)   Heart:  Regular rate and rhythm, S1 and S2 normal, no murmur, rub or gallop   Breast Exam:  Deferred to GYN  Abdomen:  Soft, non-tender, nondistended, normoactive bowel sounds, no masses, no hepatosplenomegaly   Genitalia:  Deferred to GYN        Extremities:  No clubbing, cyanosis or edema.    Pulses:  2+ and symmetric all extremities   Skin:  Skin color, texture, turgor normal, no lesions. Some hyperpigmentation/actinic changes to skin on her legs and arms.    Lymph nodes:  Cervical,  supraclavicular and inguinal nodes normal   Neurologic:  Normal strength, sensation and gait; reflexes 2+ and symmetric throughout                              Psych:  Normal mood, affect, hygiene and grooming.    ***update skin exam      02/15/2022    8:37 AM 02/06/2021    8:49 AM 02/03/2020    8:46 AM 01/21/2019    8:52 AM 01/16/2018    9:33 AM  Depression screen PHQ 2/9  Decreased Interest 3 0 0 0 0  Down, Depressed, Hopeless 3 0 0 0 0  PHQ - 2 Score 6 0 0 0 0  Altered sleeping 1 0 0    Tired, decreased energy 2 0 0    Change in appetite 1 0 0    Feeling bad or failure about yourself  1 0 0    Trouble concentrating 1 0 0    Moving slowly or fidgety/restless 1 0 0    Suicidal thoughts 0 0 0    PHQ-9 Score  13 0 0    Difficult doing work/chores Somewhat difficult Not difficult at all         ASSESSMENT/PLAN:  Flu, COVID boosters Has she had HIV or HepC from GYN ever? If not, offer, along with CBC, c-met, lipids Vit D if not on regular Optavia fuelings or supplement.  Discussed monthly self breast exams and yearly mammograms; at least 30 minutes of aerobic activity at least 5 days/week, weight-bearing exercise at least 2x/week; proper sunscreen use reviewed; healthy diet, including goals of calcium and vitamin D intake and alcohol recommendations (less than or equal to 1 drink/day) reviewed; regular seatbelt use; changing batteries in smoke detectors.  Immunization recommendations discussed--continue yearly flu shots. COVID booster Colonoscopy recommendations reviewed, UTD, due again 01/2024  F/u 1 year, sooner prn.

## 2023-03-07 ENCOUNTER — Ambulatory Visit (INDEPENDENT_AMBULATORY_CARE_PROVIDER_SITE_OTHER): Payer: 59 | Admitting: Family Medicine

## 2023-03-07 ENCOUNTER — Encounter: Payer: Self-pay | Admitting: Family Medicine

## 2023-03-07 VITALS — BP 106/76 | HR 60 | Ht 63.75 in | Wt 131.0 lb

## 2023-03-07 DIAGNOSIS — Z1159 Encounter for screening for other viral diseases: Secondary | ICD-10-CM | POA: Diagnosis not present

## 2023-03-07 DIAGNOSIS — Z0000 Encounter for general adult medical examination without abnormal findings: Secondary | ICD-10-CM

## 2023-03-07 DIAGNOSIS — F339 Major depressive disorder, recurrent, unspecified: Secondary | ICD-10-CM | POA: Diagnosis not present

## 2023-03-07 DIAGNOSIS — F411 Generalized anxiety disorder: Secondary | ICD-10-CM | POA: Diagnosis not present

## 2023-03-07 DIAGNOSIS — E559 Vitamin D deficiency, unspecified: Secondary | ICD-10-CM | POA: Diagnosis not present

## 2023-03-07 DIAGNOSIS — Z23 Encounter for immunization: Secondary | ICD-10-CM | POA: Diagnosis not present

## 2023-03-07 DIAGNOSIS — Z114 Encounter for screening for human immunodeficiency virus [HIV]: Secondary | ICD-10-CM

## 2023-03-07 LAB — POCT URINALYSIS DIP (PROADVANTAGE DEVICE)
Bilirubin, UA: NEGATIVE
Glucose, UA: NEGATIVE mg/dL
Ketones, POC UA: NEGATIVE mg/dL
Leukocytes, UA: NEGATIVE
Nitrite, UA: NEGATIVE
Protein Ur, POC: NEGATIVE mg/dL
Specific Gravity, Urine: 1.01
Urobilinogen, Ur: 0.2
pH, UA: 6.5 (ref 5.0–8.0)

## 2023-03-08 LAB — CMP14+EGFR
ALT: 13 IU/L (ref 0–32)
AST: 15 IU/L (ref 0–40)
Albumin: 4.6 g/dL (ref 3.8–4.9)
Alkaline Phosphatase: 72 IU/L (ref 44–121)
BUN/Creatinine Ratio: 17 (ref 9–23)
BUN: 14 mg/dL (ref 6–24)
Bilirubin Total: 0.5 mg/dL (ref 0.0–1.2)
CO2: 23 mmol/L (ref 20–29)
Calcium: 9.9 mg/dL (ref 8.7–10.2)
Chloride: 103 mmol/L (ref 96–106)
Creatinine, Ser: 0.84 mg/dL (ref 0.57–1.00)
Globulin, Total: 2.8 g/dL (ref 1.5–4.5)
Glucose: 90 mg/dL (ref 70–99)
Potassium: 4.5 mmol/L (ref 3.5–5.2)
Sodium: 142 mmol/L (ref 134–144)
Total Protein: 7.4 g/dL (ref 6.0–8.5)
eGFR: 83 mL/min/{1.73_m2} (ref >59)

## 2023-03-08 LAB — CBC WITH DIFFERENTIAL/PLATELET
Basophils Absolute: 0 10*3/uL (ref 0.0–0.2)
Basos: 1 %
EOS (ABSOLUTE): 0 10*3/uL (ref 0.0–0.4)
Eos: 1 %
Hematocrit: 43.6 % (ref 34.0–46.6)
Hemoglobin: 13.6 g/dL (ref 11.1–15.9)
Immature Grans (Abs): 0 10*3/uL (ref 0.0–0.1)
Immature Granulocytes: 0 %
Lymphocytes Absolute: 0.9 10*3/uL (ref 0.7–3.1)
Lymphs: 20 %
MCH: 30.4 pg (ref 26.6–33.0)
MCHC: 31.2 g/dL — ABNORMAL LOW (ref 31.5–35.7)
MCV: 97 fL (ref 79–97)
Monocytes Absolute: 0.4 10*3/uL (ref 0.1–0.9)
Monocytes: 9 %
Neutrophils Absolute: 3.3 10*3/uL (ref 1.4–7.0)
Neutrophils: 69 %
Platelets: 269 10*3/uL (ref 150–450)
RBC: 4.48 x10E6/uL (ref 3.77–5.28)
RDW: 11.1 % — ABNORMAL LOW (ref 11.7–15.4)
WBC: 4.7 10*3/uL (ref 3.4–10.8)

## 2023-03-08 LAB — LIPID PANEL
Chol/HDL Ratio: 2.5 ratio (ref 0.0–4.4)
Cholesterol, Total: 191 mg/dL (ref 100–199)
HDL: 75 mg/dL (ref >39)
LDL Chol Calc (NIH): 104 mg/dL — ABNORMAL HIGH (ref 0–99)
Triglycerides: 62 mg/dL (ref 0–149)
VLDL Cholesterol Cal: 12 mg/dL (ref 5–40)

## 2023-03-08 LAB — HIV ANTIBODY (ROUTINE TESTING W REFLEX): HIV Screen 4th Generation wRfx: NONREACTIVE

## 2023-03-08 LAB — HEPATITIS C ANTIBODY: Hep C Virus Ab: NONREACTIVE

## 2023-03-11 ENCOUNTER — Other Ambulatory Visit (HOSPITAL_COMMUNITY): Payer: Self-pay

## 2023-03-11 MED ORDER — REXULTI 1 MG PO TABS
1.0000 mg | ORAL_TABLET | Freq: Every day | ORAL | 2 refills | Status: DC
Start: 2023-03-09 — End: 2024-03-16
  Filled 2023-03-11: qty 30, 30d supply, fill #0

## 2023-03-18 ENCOUNTER — Other Ambulatory Visit (HOSPITAL_COMMUNITY): Payer: Self-pay

## 2023-03-18 MED ORDER — RISPERIDONE 0.5 MG PO TABS
0.5000 mg | ORAL_TABLET | Freq: Every day | ORAL | 2 refills | Status: DC
Start: 2023-03-18 — End: 2023-05-20
  Filled 2023-03-18: qty 30, 30d supply, fill #0
  Filled 2023-04-12: qty 30, 30d supply, fill #1
  Filled 2023-05-13: qty 30, 30d supply, fill #2

## 2023-03-18 MED ORDER — LISDEXAMFETAMINE DIMESYLATE 40 MG PO CAPS
40.0000 mg | ORAL_CAPSULE | Freq: Every morning | ORAL | 0 refills | Status: DC
Start: 2023-04-04 — End: 2024-03-16
  Filled 2023-04-04: qty 30, 30d supply, fill #0

## 2023-03-27 ENCOUNTER — Other Ambulatory Visit (HOSPITAL_COMMUNITY): Payer: Self-pay

## 2023-03-27 MED ORDER — ESTRADIOL 0.05 MG/24HR TD PTTW
1.0000 | MEDICATED_PATCH | TRANSDERMAL | 3 refills | Status: DC
  Filled 2023-03-27: qty 24, 84d supply, fill #0
  Filled 2023-06-17 (×2): qty 24, 84d supply, fill #1
  Filled 2023-09-12: qty 24, 84d supply, fill #2
  Filled 2024-03-04: qty 24, 84d supply, fill #3

## 2023-03-27 MED ORDER — ESTRADIOL 0.05 MG/24HR TD PTTW
1.0000 | MEDICATED_PATCH | TRANSDERMAL | 3 refills | Status: DC
  Filled 2023-03-27: qty 8, 28d supply, fill #0

## 2023-03-28 ENCOUNTER — Other Ambulatory Visit (HOSPITAL_COMMUNITY): Payer: Self-pay

## 2023-04-03 ENCOUNTER — Other Ambulatory Visit: Payer: Self-pay

## 2023-04-03 ENCOUNTER — Other Ambulatory Visit (HOSPITAL_COMMUNITY): Payer: Self-pay

## 2023-04-04 ENCOUNTER — Other Ambulatory Visit (HOSPITAL_COMMUNITY): Payer: Self-pay

## 2023-04-09 DIAGNOSIS — N951 Menopausal and female climacteric states: Secondary | ICD-10-CM | POA: Diagnosis not present

## 2023-04-09 DIAGNOSIS — Z8742 Personal history of other diseases of the female genital tract: Secondary | ICD-10-CM | POA: Diagnosis not present

## 2023-04-09 DIAGNOSIS — R3915 Urgency of urination: Secondary | ICD-10-CM | POA: Diagnosis not present

## 2023-04-09 DIAGNOSIS — Z1389 Encounter for screening for other disorder: Secondary | ICD-10-CM | POA: Diagnosis not present

## 2023-04-09 DIAGNOSIS — Z6822 Body mass index (BMI) 22.0-22.9, adult: Secondary | ICD-10-CM | POA: Diagnosis not present

## 2023-04-09 DIAGNOSIS — Z01419 Encounter for gynecological examination (general) (routine) without abnormal findings: Secondary | ICD-10-CM | POA: Diagnosis not present

## 2023-04-09 LAB — HM PAP SMEAR
HM Pap smear: NEGATIVE
HPV, high-risk: NEGATIVE

## 2023-04-09 LAB — RESULTS CONSOLE HPV: CHL HPV: NEGATIVE

## 2023-04-11 DIAGNOSIS — F332 Major depressive disorder, recurrent severe without psychotic features: Secondary | ICD-10-CM | POA: Diagnosis not present

## 2023-04-12 ENCOUNTER — Other Ambulatory Visit (HOSPITAL_COMMUNITY): Payer: Self-pay

## 2023-04-12 DIAGNOSIS — F332 Major depressive disorder, recurrent severe without psychotic features: Secondary | ICD-10-CM | POA: Diagnosis not present

## 2023-04-15 DIAGNOSIS — F332 Major depressive disorder, recurrent severe without psychotic features: Secondary | ICD-10-CM | POA: Diagnosis not present

## 2023-04-16 ENCOUNTER — Other Ambulatory Visit (HOSPITAL_COMMUNITY): Payer: Self-pay

## 2023-04-16 DIAGNOSIS — F332 Major depressive disorder, recurrent severe without psychotic features: Secondary | ICD-10-CM | POA: Diagnosis not present

## 2023-04-16 MED ORDER — RISPERIDONE 1 MG PO TABS
1.0000 mg | ORAL_TABLET | Freq: Every day | ORAL | 2 refills | Status: DC
Start: 2023-04-16 — End: 2024-03-16
  Filled 2023-04-16: qty 30, 30d supply, fill #0
  Filled 2023-05-20: qty 30, 30d supply, fill #1
  Filled 2023-06-17: qty 30, 30d supply, fill #2

## 2023-04-17 ENCOUNTER — Other Ambulatory Visit (HOSPITAL_COMMUNITY): Payer: Self-pay

## 2023-04-17 DIAGNOSIS — F332 Major depressive disorder, recurrent severe without psychotic features: Secondary | ICD-10-CM | POA: Diagnosis not present

## 2023-04-17 MED ORDER — LISDEXAMFETAMINE DIMESYLATE 40 MG PO CAPS
40.0000 mg | ORAL_CAPSULE | Freq: Every morning | ORAL | 0 refills | Status: DC
Start: 2023-05-04 — End: 2024-03-16
  Filled 2023-05-06: qty 30, 30d supply, fill #0

## 2023-04-18 DIAGNOSIS — F332 Major depressive disorder, recurrent severe without psychotic features: Secondary | ICD-10-CM | POA: Diagnosis not present

## 2023-04-19 DIAGNOSIS — F332 Major depressive disorder, recurrent severe without psychotic features: Secondary | ICD-10-CM | POA: Diagnosis not present

## 2023-04-22 DIAGNOSIS — F332 Major depressive disorder, recurrent severe without psychotic features: Secondary | ICD-10-CM | POA: Diagnosis not present

## 2023-04-23 DIAGNOSIS — F332 Major depressive disorder, recurrent severe without psychotic features: Secondary | ICD-10-CM | POA: Diagnosis not present

## 2023-04-24 DIAGNOSIS — F332 Major depressive disorder, recurrent severe without psychotic features: Secondary | ICD-10-CM | POA: Diagnosis not present

## 2023-04-25 DIAGNOSIS — F332 Major depressive disorder, recurrent severe without psychotic features: Secondary | ICD-10-CM | POA: Diagnosis not present

## 2023-04-26 DIAGNOSIS — F332 Major depressive disorder, recurrent severe without psychotic features: Secondary | ICD-10-CM | POA: Diagnosis not present

## 2023-04-29 DIAGNOSIS — F332 Major depressive disorder, recurrent severe without psychotic features: Secondary | ICD-10-CM | POA: Diagnosis not present

## 2023-04-30 DIAGNOSIS — F332 Major depressive disorder, recurrent severe without psychotic features: Secondary | ICD-10-CM | POA: Diagnosis not present

## 2023-05-01 DIAGNOSIS — F332 Major depressive disorder, recurrent severe without psychotic features: Secondary | ICD-10-CM | POA: Diagnosis not present

## 2023-05-02 ENCOUNTER — Other Ambulatory Visit (HOSPITAL_COMMUNITY): Payer: Self-pay

## 2023-05-06 ENCOUNTER — Other Ambulatory Visit (HOSPITAL_COMMUNITY): Payer: Self-pay

## 2023-05-10 ENCOUNTER — Other Ambulatory Visit (HOSPITAL_COMMUNITY): Payer: Self-pay

## 2023-05-11 MED ORDER — PROGESTERONE 200 MG PO CAPS
200.0000 mg | ORAL_CAPSULE | Freq: Every day | ORAL | 3 refills | Status: DC
  Filled 2023-05-11: qty 30, 30d supply, fill #0
  Filled 2023-06-17: qty 30, 30d supply, fill #1
  Filled 2023-07-15: qty 30, 30d supply, fill #2
  Filled 2023-08-19: qty 30, 30d supply, fill #3

## 2023-05-13 ENCOUNTER — Other Ambulatory Visit (HOSPITAL_COMMUNITY): Payer: Self-pay

## 2023-05-13 ENCOUNTER — Other Ambulatory Visit: Payer: Self-pay

## 2023-05-13 MED ORDER — TRINTELLIX 20 MG PO TABS
20.0000 mg | ORAL_TABLET | Freq: Every day | ORAL | 2 refills | Status: DC
  Filled 2023-06-04: qty 30, 30d supply, fill #0
  Filled 2023-09-27: qty 30, 30d supply, fill #1

## 2023-05-13 MED ORDER — LISDEXAMFETAMINE DIMESYLATE 40 MG PO CAPS
40.0000 mg | ORAL_CAPSULE | Freq: Every morning | ORAL | 0 refills | Status: DC
Start: 2023-06-04 — End: 2024-03-16
  Filled 2023-06-04: qty 30, 30d supply, fill #0

## 2023-05-14 ENCOUNTER — Other Ambulatory Visit (HOSPITAL_COMMUNITY): Payer: Self-pay

## 2023-05-15 ENCOUNTER — Other Ambulatory Visit (HOSPITAL_COMMUNITY): Payer: Self-pay

## 2023-05-15 ENCOUNTER — Encounter: Payer: Self-pay | Admitting: *Deleted

## 2023-05-20 ENCOUNTER — Other Ambulatory Visit (HOSPITAL_COMMUNITY): Payer: Self-pay

## 2023-05-20 DIAGNOSIS — F332 Major depressive disorder, recurrent severe without psychotic features: Secondary | ICD-10-CM | POA: Diagnosis not present

## 2023-05-21 DIAGNOSIS — F332 Major depressive disorder, recurrent severe without psychotic features: Secondary | ICD-10-CM | POA: Diagnosis not present

## 2023-05-22 DIAGNOSIS — F332 Major depressive disorder, recurrent severe without psychotic features: Secondary | ICD-10-CM | POA: Diagnosis not present

## 2023-05-23 DIAGNOSIS — F332 Major depressive disorder, recurrent severe without psychotic features: Secondary | ICD-10-CM | POA: Diagnosis not present

## 2023-06-01 DIAGNOSIS — F322 Major depressive disorder, single episode, severe without psychotic features: Secondary | ICD-10-CM | POA: Diagnosis not present

## 2023-06-03 ENCOUNTER — Other Ambulatory Visit (HOSPITAL_COMMUNITY): Payer: Self-pay

## 2023-06-04 ENCOUNTER — Other Ambulatory Visit: Payer: Self-pay

## 2023-06-04 ENCOUNTER — Other Ambulatory Visit (HOSPITAL_COMMUNITY): Payer: Self-pay

## 2023-06-05 ENCOUNTER — Other Ambulatory Visit (HOSPITAL_COMMUNITY): Payer: Self-pay

## 2023-06-05 MED ORDER — AUVELITY 45-105 MG PO TBCR
1.0000 | EXTENDED_RELEASE_TABLET | Freq: Every morning | ORAL | 0 refills | Status: DC
  Filled 2023-06-05 – 2023-07-15 (×3): qty 90, 90d supply, fill #0

## 2023-06-05 MED ORDER — LISDEXAMFETAMINE DIMESYLATE 40 MG PO CAPS: 40.0000 mg | ORAL_CAPSULE | Freq: Every morning | ORAL | 0 refills | Status: DC

## 2023-06-05 MED ORDER — TRINTELLIX 20 MG PO TABS
20.0000 mg | ORAL_TABLET | Freq: Every day | ORAL | 0 refills | Status: DC
  Filled 2023-07-03: qty 90, 90d supply, fill #0

## 2023-06-05 MED ORDER — RISPERIDONE 1 MG PO TABS: 1.0000 mg | ORAL_TABLET | Freq: Every evening | ORAL | 0 refills | Status: DC

## 2023-06-10 ENCOUNTER — Other Ambulatory Visit (HOSPITAL_COMMUNITY): Payer: Self-pay

## 2023-06-17 ENCOUNTER — Other Ambulatory Visit (HOSPITAL_COMMUNITY): Payer: Self-pay

## 2023-06-18 ENCOUNTER — Other Ambulatory Visit: Payer: Self-pay

## 2023-07-03 ENCOUNTER — Other Ambulatory Visit: Payer: Self-pay

## 2023-07-03 ENCOUNTER — Other Ambulatory Visit (HOSPITAL_COMMUNITY): Payer: Self-pay

## 2023-07-03 MED ORDER — LISDEXAMFETAMINE DIMESYLATE 40 MG PO CAPS
40.0000 mg | ORAL_CAPSULE | Freq: Every morning | ORAL | 0 refills | Status: DC
  Filled 2023-07-04: qty 30, 30d supply, fill #0

## 2023-07-03 MED ORDER — BUPROPION HCL ER (SR) 100 MG PO TB12
100.0000 mg | ORAL_TABLET | Freq: Every morning | ORAL | 0 refills | Status: DC
  Filled 2023-07-03: qty 30, 30d supply, fill #0

## 2023-07-04 ENCOUNTER — Other Ambulatory Visit (HOSPITAL_COMMUNITY): Payer: Self-pay

## 2023-07-04 ENCOUNTER — Other Ambulatory Visit: Payer: Self-pay

## 2023-07-10 ENCOUNTER — Other Ambulatory Visit (HOSPITAL_COMMUNITY): Payer: Self-pay

## 2023-07-12 ENCOUNTER — Other Ambulatory Visit (HOSPITAL_COMMUNITY): Payer: Self-pay

## 2023-07-12 MED ORDER — RISPERIDONE 1 MG PO TABS
1.0000 mg | ORAL_TABLET | Freq: Every day | ORAL | 0 refills | Status: DC
  Filled 2023-07-12: qty 90, 90d supply, fill #0

## 2023-07-13 DIAGNOSIS — F322 Major depressive disorder, single episode, severe without psychotic features: Secondary | ICD-10-CM | POA: Diagnosis not present

## 2023-07-15 ENCOUNTER — Other Ambulatory Visit (HOSPITAL_COMMUNITY): Payer: Self-pay

## 2023-07-30 ENCOUNTER — Other Ambulatory Visit (HOSPITAL_COMMUNITY): Payer: Self-pay

## 2023-07-30 MED ORDER — BUPROPION HCL ER (SR) 200 MG PO TB12
200.0000 mg | ORAL_TABLET | Freq: Every morning | ORAL | 0 refills | Status: DC
  Filled 2023-07-30: qty 30, 30d supply, fill #0

## 2023-07-31 DIAGNOSIS — F332 Major depressive disorder, recurrent severe without psychotic features: Secondary | ICD-10-CM | POA: Diagnosis not present

## 2023-08-01 ENCOUNTER — Other Ambulatory Visit (HOSPITAL_COMMUNITY): Payer: Self-pay

## 2023-08-01 MED ORDER — LISDEXAMFETAMINE DIMESYLATE 40 MG PO CAPS
40.0000 mg | ORAL_CAPSULE | Freq: Every morning | ORAL | 0 refills | Status: DC
  Filled 2023-08-01: qty 30, 30d supply, fill #0

## 2023-08-03 DIAGNOSIS — F322 Major depressive disorder, single episode, severe without psychotic features: Secondary | ICD-10-CM | POA: Diagnosis not present

## 2023-08-09 ENCOUNTER — Other Ambulatory Visit (HOSPITAL_COMMUNITY): Payer: Self-pay

## 2023-08-12 ENCOUNTER — Other Ambulatory Visit: Payer: Self-pay

## 2023-08-12 ENCOUNTER — Other Ambulatory Visit (HOSPITAL_COMMUNITY): Payer: Self-pay

## 2023-08-12 MED ORDER — AUVELITY 45-105 MG PO TBCR: 1.0000 | EXTENDED_RELEASE_TABLET | Freq: Every morning | ORAL | 0 refills | Status: DC

## 2023-08-13 DIAGNOSIS — Z1231 Encounter for screening mammogram for malignant neoplasm of breast: Secondary | ICD-10-CM | POA: Diagnosis not present

## 2023-08-13 LAB — HM MAMMOGRAPHY

## 2023-08-14 ENCOUNTER — Encounter: Payer: Self-pay | Admitting: Family Medicine

## 2023-08-15 ENCOUNTER — Ambulatory Visit: Payer: Self-pay | Admitting: Sports Medicine

## 2023-08-19 ENCOUNTER — Other Ambulatory Visit (HOSPITAL_COMMUNITY): Payer: Self-pay

## 2023-08-28 ENCOUNTER — Other Ambulatory Visit (HOSPITAL_COMMUNITY): Payer: Self-pay

## 2023-08-28 MED ORDER — BUPROPION HCL ER (SR) 200 MG PO TB12
200.0000 mg | ORAL_TABLET | Freq: Every morning | ORAL | 2 refills | Status: DC
  Filled 2023-08-28: qty 30, 30d supply, fill #0
  Filled 2023-09-26: qty 30, 30d supply, fill #1

## 2023-09-02 ENCOUNTER — Other Ambulatory Visit (HOSPITAL_COMMUNITY): Payer: Self-pay

## 2023-09-02 ENCOUNTER — Other Ambulatory Visit: Payer: Self-pay

## 2023-09-02 MED ORDER — LISDEXAMFETAMINE DIMESYLATE 40 MG PO CAPS
40.0000 mg | ORAL_CAPSULE | Freq: Every morning | ORAL | 0 refills | Status: DC
  Filled 2023-09-02: qty 30, 30d supply, fill #0

## 2023-09-03 ENCOUNTER — Other Ambulatory Visit (HOSPITAL_COMMUNITY): Payer: Self-pay

## 2023-09-03 ENCOUNTER — Other Ambulatory Visit: Payer: Self-pay

## 2023-09-03 MED ORDER — CLONAZEPAM 1 MG PO TABS
1.0000 mg | ORAL_TABLET | Freq: Two times a day (BID) | ORAL | 2 refills | Status: DC
  Filled 2023-09-03 – 2023-09-04 (×2): qty 60, 30d supply, fill #0
  Filled 2023-11-06: qty 60, 30d supply, fill #1
  Filled 2023-12-27: qty 60, 30d supply, fill #2

## 2023-09-04 ENCOUNTER — Other Ambulatory Visit (HOSPITAL_COMMUNITY): Payer: Self-pay

## 2023-09-04 ENCOUNTER — Other Ambulatory Visit: Payer: Self-pay

## 2023-09-05 ENCOUNTER — Other Ambulatory Visit: Payer: Self-pay

## 2023-09-05 ENCOUNTER — Other Ambulatory Visit (HOSPITAL_COMMUNITY): Payer: Self-pay

## 2023-09-05 MED ORDER — SPRAVATO (56 MG DOSE) 28 MG/DEVICE NA SOPK
PACK | NASAL | 0 refills | Status: DC
  Filled 2023-09-05: qty 2, 3d supply, fill #0

## 2023-09-05 MED ORDER — SPRAVATO (84 MG DOSE) 28 MG/DEVICE NA SOPK
PACK | NASAL | 5 refills | Status: DC
  Filled 2023-09-13: qty 6, 7d supply, fill #0
  Filled 2023-09-23: qty 6, 7d supply, fill #1
  Filled 2023-09-26: qty 6, 7d supply, fill #2
  Filled 2023-10-04: qty 6, 7d supply, fill #3

## 2023-09-05 MED ORDER — SPRAVATO (84 MG DOSE) 28 MG/DEVICE NA SOPK
PACK | NASAL | 0 refills | Status: DC
  Filled 2023-09-10: qty 3, 7d supply, fill #0

## 2023-09-05 NOTE — Progress Notes (Signed)
 Copay card on file. Copay now $10.

## 2023-09-05 NOTE — Progress Notes (Signed)
 Specialty Pharmacy Initial Fill Coordination Note  Sheila Obrien is a 53 y.o. female contacted today regarding initial fill of specialty medication(s) Esketamine HCl (Spravato (84 MG Dose))   Patient requested Courier to Provider Office   Delivery date: 09/09/23   Verified address: [facility], [address], [place], [place]   Medication will be filled on 09/09/23.    Patient is aware of $10 copay, credit card is on file for payment.    Medication will be same day couriered 09/09/23, signature required, ATTN: Phoebe Sharps   The supervising provider for REMS authorization is Ellamae Sia (DEA# [id number])

## 2023-09-09 ENCOUNTER — Other Ambulatory Visit (HOSPITAL_COMMUNITY): Payer: Self-pay

## 2023-09-09 ENCOUNTER — Other Ambulatory Visit: Payer: Self-pay

## 2023-09-09 NOTE — Progress Notes (Signed)
 Same-Day courier (signature required) on 09/09/23.  Control Number: [id number]

## 2023-09-09 NOTE — Progress Notes (Signed)
 09/09/23 REMS Authorization Code: [employer]

## 2023-09-10 ENCOUNTER — Other Ambulatory Visit (HOSPITAL_COMMUNITY): Payer: Self-pay

## 2023-09-10 ENCOUNTER — Other Ambulatory Visit: Payer: Self-pay

## 2023-09-10 DIAGNOSIS — F332 Major depressive disorder, recurrent severe without psychotic features: Secondary | ICD-10-CM | POA: Diagnosis not present

## 2023-09-10 NOTE — Progress Notes (Signed)
 Specialty Pharmacy Refill Coordination Note  Sheila Obrien is a 53 y.o. female contacted today regarding refills of specialty medication(s) Esketamine HCl (Spravato (84 MG Dose))   Patient requested Courier to Provider Office   Delivery date: 09/11/23   Verified address: [facility], [address], [place], [place]   Medication will be filled on 09/11/23, same day courier, signature required, ATTN: Kiana.   Supervising provider is Ellamae Sia (DEA# [id number])   REMS authorization will be added on date of dispense.

## 2023-09-11 ENCOUNTER — Other Ambulatory Visit: Payer: Self-pay

## 2023-09-11 ENCOUNTER — Other Ambulatory Visit (HOSPITAL_COMMUNITY): Payer: Self-pay

## 2023-09-11 NOTE — Progress Notes (Signed)
 09/11/50 REMS Authorization Code: [id number]

## 2023-09-11 NOTE — Progress Notes (Signed)
 Same-Day courier (signature required) on 03.26.25.  Control Number: [id number]

## 2023-09-12 ENCOUNTER — Other Ambulatory Visit (HOSPITAL_COMMUNITY): Payer: Self-pay

## 2023-09-12 DIAGNOSIS — F332 Major depressive disorder, recurrent severe without psychotic features: Secondary | ICD-10-CM | POA: Diagnosis not present

## 2023-09-12 MED ORDER — PROGESTERONE 200 MG PO CAPS
200.0000 mg | ORAL_CAPSULE | Freq: Every day | ORAL | 3 refills | Status: DC
Start: 2023-09-12 — End: 2024-03-16
  Filled 2023-09-12: qty 30, 30d supply, fill #0
  Filled 2023-10-16: qty 30, 30d supply, fill #1
  Filled 2023-11-14: qty 30, 30d supply, fill #2
  Filled 2023-12-16: qty 30, 30d supply, fill #3

## 2023-09-13 ENCOUNTER — Other Ambulatory Visit: Payer: Self-pay

## 2023-09-14 DIAGNOSIS — F322 Major depressive disorder, single episode, severe without psychotic features: Secondary | ICD-10-CM | POA: Diagnosis not present

## 2023-09-16 ENCOUNTER — Other Ambulatory Visit: Payer: Self-pay

## 2023-09-16 NOTE — Progress Notes (Signed)
 Specialty Pharmacy Refill Coordination Note  Sheila Obrien is a 53 y.o. female assessed today regarding refills of clinic administered specialty medication(s) Spravato  Clinic requested Courier to Provider Office  Delivery date: 09/16/23 Verified address: [facility], [address], [place], [place]   Medication will be filled on 09/16/23.   Patient next appt is 4/1 @2pm . Rx needs prior authorization. Phoebe Sharps aware that we would need approval asap by no later than 9:30am on 4/1 if they need it same day otherwise they may have to reschedule pt appt.

## 2023-09-17 ENCOUNTER — Other Ambulatory Visit: Payer: Self-pay

## 2023-09-17 ENCOUNTER — Other Ambulatory Visit (HOSPITAL_COMMUNITY): Payer: Self-pay

## 2023-09-17 MED ORDER — SPRAVATO (56 MG DOSE) 28 MG/DEVICE NA SOPK
PACK | NASAL | 0 refills | Status: DC
  Filled 2023-09-17: qty 2, 3d supply, fill #0

## 2023-09-18 ENCOUNTER — Other Ambulatory Visit: Payer: Self-pay

## 2023-09-18 DIAGNOSIS — F332 Major depressive disorder, recurrent severe without psychotic features: Secondary | ICD-10-CM | POA: Diagnosis not present

## 2023-09-18 NOTE — Progress Notes (Signed)
 Updated PA was required delaying delivery until 4/2. Kiana aware.   09/18/23 REMS Authorization Code: [id number]

## 2023-09-19 DIAGNOSIS — F332 Major depressive disorder, recurrent severe without psychotic features: Secondary | ICD-10-CM | POA: Diagnosis not present

## 2023-09-23 ENCOUNTER — Other Ambulatory Visit: Payer: Self-pay

## 2023-09-23 NOTE — Progress Notes (Signed)
 Specialty Pharmacy Refill Coordination Note  Sheila Obrien is a 53 y.o. female assessed today regarding refills of clinic administered specialty medication(s) Spravato  Clinic requested Courier to Provider Office   Delivery date: 09/23/23   Verified address: [facility], [address], [place], [place]   Medication will be filled on 09/23/23.   Patient ended up having 2 appointments last week using up supply that was delivered. This week's appointments are 4/8 and 4/10.

## 2023-09-24 DIAGNOSIS — F332 Major depressive disorder, recurrent severe without psychotic features: Secondary | ICD-10-CM | POA: Diagnosis not present

## 2023-09-26 ENCOUNTER — Other Ambulatory Visit: Payer: Self-pay

## 2023-09-26 ENCOUNTER — Other Ambulatory Visit (HOSPITAL_COMMUNITY): Payer: Self-pay

## 2023-09-26 DIAGNOSIS — F332 Major depressive disorder, recurrent severe without psychotic features: Secondary | ICD-10-CM | POA: Diagnosis not present

## 2023-09-26 NOTE — Progress Notes (Signed)
 Specialty Pharmacy Refill Coordination Note  Sheila Obrien is a 53 y.o. female contacted today regarding refills of specialty medication(s) Esketamine HCl (Spravato (56 MG Dose))   Patient requested Courier to Provider Office   Delivery date: 09/30/23   Verified address: [facility], [address], [place], [place]   Medication will be filled on 09/30/23. Same day courier, signature required.   REMs authorization information will be added on date of dispense.

## 2023-09-27 ENCOUNTER — Other Ambulatory Visit (HOSPITAL_COMMUNITY): Payer: Self-pay

## 2023-09-27 MED ORDER — LISDEXAMFETAMINE DIMESYLATE 40 MG PO CAPS
40.0000 mg | ORAL_CAPSULE | Freq: Every morning | ORAL | 0 refills | Status: DC
  Filled 2023-10-02: qty 30, 30d supply, fill #0

## 2023-09-30 ENCOUNTER — Other Ambulatory Visit: Payer: Self-pay

## 2023-09-30 NOTE — Progress Notes (Signed)
 09/30/23 REMS Authorization Code: [id number]

## 2023-10-01 DIAGNOSIS — F332 Major depressive disorder, recurrent severe without psychotic features: Secondary | ICD-10-CM | POA: Diagnosis not present

## 2023-10-02 ENCOUNTER — Other Ambulatory Visit (HOSPITAL_COMMUNITY): Payer: Self-pay

## 2023-10-03 DIAGNOSIS — F332 Major depressive disorder, recurrent severe without psychotic features: Secondary | ICD-10-CM | POA: Diagnosis not present

## 2023-10-04 ENCOUNTER — Other Ambulatory Visit (HOSPITAL_COMMUNITY): Payer: Self-pay

## 2023-10-04 ENCOUNTER — Other Ambulatory Visit: Payer: Self-pay

## 2023-10-04 MED ORDER — SPRAVATO (84 MG DOSE) 28 MG/DEVICE NA SOPK
PACK | NASAL | 5 refills | Status: DC
  Filled 2023-10-04: qty 3, 7d supply, fill #0
  Filled 2023-10-11: qty 3, 7d supply, fill #1

## 2023-10-04 MED ORDER — OLANZAPINE 2.5 MG PO TABS
5.0000 mg | ORAL_TABLET | Freq: Every day | ORAL | 0 refills | Status: DC
  Filled 2023-10-04: qty 60, 30d supply, fill #0

## 2023-10-04 MED ORDER — TRINTELLIX 20 MG PO TABS
20.0000 mg | ORAL_TABLET | Freq: Every day | ORAL | 0 refills | Status: DC
Start: 2023-10-04 — End: 2024-03-16
  Filled 2023-10-04 – 2023-10-30 (×2): qty 90, 90d supply, fill #0

## 2023-10-04 NOTE — Progress Notes (Signed)
 Specialty Pharmacy Refill Coordination Note  Sheila Obrien is a 53 y.o. female assessed today regarding refills of clinic administered specialty medication(s) Esketamine HCl (Spravato  (84 MG Dose))   Clinic requested Courier to Provider Office   Delivery date: 10/07/23   Verified address: [facility], [address], [place], [place]   Medication will be filled on 10/07/23.   Patient now down to once weekly dosing. Next appt 4/24

## 2023-10-07 ENCOUNTER — Other Ambulatory Visit (HOSPITAL_COMMUNITY): Payer: Self-pay

## 2023-10-07 ENCOUNTER — Other Ambulatory Visit: Payer: Self-pay

## 2023-10-07 MED ORDER — AUVELITY 45-105 MG PO TBCR
1.0000 | EXTENDED_RELEASE_TABLET | Freq: Every morning | ORAL | 0 refills | Status: DC
  Filled 2023-10-07: qty 90, 90d supply, fill #0

## 2023-10-07 NOTE — Progress Notes (Signed)
 10/07/23 REMS Authorization Code: [id number]

## 2023-10-07 NOTE — Progress Notes (Signed)
 Same-Day courier has been set up on 10/07/23. Signature is required.  Control Number:  [id number]   Verified address: [facility], [address], [place], [place]

## 2023-10-09 ENCOUNTER — Other Ambulatory Visit (HOSPITAL_COMMUNITY): Payer: Self-pay

## 2023-10-10 ENCOUNTER — Other Ambulatory Visit (HOSPITAL_COMMUNITY): Payer: Self-pay

## 2023-10-10 DIAGNOSIS — F332 Major depressive disorder, recurrent severe without psychotic features: Secondary | ICD-10-CM | POA: Diagnosis not present

## 2023-10-10 MED ORDER — LISDEXAMFETAMINE DIMESYLATE 40 MG PO CAPS
40.0000 mg | ORAL_CAPSULE | Freq: Every morning | ORAL | 0 refills | Status: DC
  Filled 2023-11-01: qty 30, 30d supply, fill #0

## 2023-10-11 ENCOUNTER — Other Ambulatory Visit: Payer: Self-pay

## 2023-10-11 NOTE — Progress Notes (Deleted)
 Specialty Pharmacy Refill Coordination Note  Sheila Obrien is a 53 y.o. female assessed today regarding refills of clinic administered specialty medication(s) Esketamine HCl (Spravato  (84 MG Dose))   Clinic requested Courier to Provider Office   Delivery date: 10/14/23   Verified address: [facility], [address], [place], [place]   Medication will be filled on 10/14/23.   Patient remains on 84mg  once weekly, next appt Thurs 5/1.

## 2023-10-11 NOTE — Progress Notes (Signed)
 Sheila Obrien called back from [facility] stating patient discontinued treatment yesterday. Delivery for next week cancelled and patient disenrolled.

## 2023-10-12 DIAGNOSIS — F322 Major depressive disorder, single episode, severe without psychotic features: Secondary | ICD-10-CM | POA: Diagnosis not present

## 2023-10-16 ENCOUNTER — Other Ambulatory Visit (HOSPITAL_COMMUNITY): Payer: Self-pay

## 2023-10-26 DIAGNOSIS — F322 Major depressive disorder, single episode, severe without psychotic features: Secondary | ICD-10-CM | POA: Diagnosis not present

## 2023-10-30 ENCOUNTER — Other Ambulatory Visit (HOSPITAL_COMMUNITY): Payer: Self-pay

## 2023-11-01 ENCOUNTER — Other Ambulatory Visit (HOSPITAL_COMMUNITY): Payer: Self-pay

## 2023-11-01 ENCOUNTER — Other Ambulatory Visit: Payer: Self-pay

## 2023-11-01 MED ORDER — CAPLYTA 21 MG PO CAPS
21.0000 mg | ORAL_CAPSULE | Freq: Every day | ORAL | 0 refills | Status: DC
  Filled 2023-11-01: qty 30, 30d supply, fill #0

## 2023-11-01 MED ORDER — LISDEXAMFETAMINE DIMESYLATE 50 MG PO CAPS
50.0000 mg | ORAL_CAPSULE | Freq: Every morning | ORAL | 0 refills | Status: DC
  Filled 2023-11-01: qty 30, 30d supply, fill #0

## 2023-11-06 ENCOUNTER — Other Ambulatory Visit: Payer: Self-pay

## 2023-11-06 ENCOUNTER — Other Ambulatory Visit (HOSPITAL_COMMUNITY): Payer: Self-pay

## 2023-11-06 ENCOUNTER — Encounter: Payer: Self-pay | Admitting: Pharmacist

## 2023-11-06 MED ORDER — CLOMIPRAMINE HCL 25 MG PO CAPS
25.0000 mg | ORAL_CAPSULE | Freq: Every evening | ORAL | 2 refills | Status: DC
  Filled 2023-11-06: qty 30, 30d supply, fill #0

## 2023-11-06 MED ORDER — TRINTELLIX 10 MG PO TABS
10.0000 mg | ORAL_TABLET | Freq: Every day | ORAL | 0 refills | Status: DC
  Filled 2023-11-06: qty 14, 14d supply, fill #0

## 2023-11-07 ENCOUNTER — Other Ambulatory Visit (HOSPITAL_COMMUNITY): Payer: Self-pay

## 2023-11-14 ENCOUNTER — Other Ambulatory Visit (HOSPITAL_COMMUNITY): Payer: Self-pay

## 2023-11-16 DIAGNOSIS — F322 Major depressive disorder, single episode, severe without psychotic features: Secondary | ICD-10-CM | POA: Diagnosis not present

## 2023-11-18 ENCOUNTER — Other Ambulatory Visit (HOSPITAL_COMMUNITY): Payer: Self-pay

## 2023-11-18 MED ORDER — PROGESTERONE 200 MG PO CAPS
200.0000 mg | ORAL_CAPSULE | Freq: Every day | ORAL | 0 refills | Status: DC
  Filled 2023-11-19 – 2024-01-13 (×2): qty 90, 90d supply, fill #0

## 2023-11-18 MED ORDER — ESTRADIOL 0.05 MG/24HR TD PTTW
1.0000 | MEDICATED_PATCH | TRANSDERMAL | 0 refills | Status: DC
  Filled 2023-11-18 – 2023-11-19 (×2): qty 24, 84d supply, fill #0

## 2023-11-19 ENCOUNTER — Other Ambulatory Visit (HOSPITAL_COMMUNITY): Payer: Self-pay

## 2023-11-21 ENCOUNTER — Other Ambulatory Visit (HOSPITAL_COMMUNITY): Payer: Self-pay

## 2023-11-27 ENCOUNTER — Other Ambulatory Visit (HOSPITAL_COMMUNITY): Payer: Self-pay

## 2023-11-27 MED ORDER — AUVELITY 45-105 MG PO TBCR
1.0000 | EXTENDED_RELEASE_TABLET | Freq: Every morning | ORAL | 0 refills | Status: DC
  Filled 2024-01-01: qty 90, 90d supply, fill #0

## 2023-11-27 MED ORDER — CLOMIPRAMINE HCL 50 MG PO CAPS
100.0000 mg | ORAL_CAPSULE | Freq: Every day | ORAL | 2 refills | Status: DC
  Filled 2023-11-27: qty 60, 30d supply, fill #0

## 2023-11-27 MED ORDER — LISDEXAMFETAMINE DIMESYLATE 50 MG PO CAPS
50.0000 mg | ORAL_CAPSULE | Freq: Every morning | ORAL | 0 refills | Status: DC
  Filled 2023-11-29: qty 30, 30d supply, fill #0

## 2023-11-28 ENCOUNTER — Other Ambulatory Visit (HOSPITAL_COMMUNITY): Payer: Self-pay

## 2023-11-29 ENCOUNTER — Other Ambulatory Visit: Payer: Self-pay

## 2023-11-29 ENCOUNTER — Other Ambulatory Visit (HOSPITAL_COMMUNITY): Payer: Self-pay

## 2023-12-06 ENCOUNTER — Other Ambulatory Visit (HOSPITAL_COMMUNITY): Payer: Self-pay

## 2023-12-07 DIAGNOSIS — F322 Major depressive disorder, single episode, severe without psychotic features: Secondary | ICD-10-CM | POA: Diagnosis not present

## 2023-12-16 ENCOUNTER — Other Ambulatory Visit (HOSPITAL_COMMUNITY): Payer: Self-pay

## 2023-12-16 ENCOUNTER — Other Ambulatory Visit: Payer: Self-pay

## 2023-12-19 ENCOUNTER — Other Ambulatory Visit (HOSPITAL_COMMUNITY): Payer: Self-pay

## 2023-12-24 ENCOUNTER — Other Ambulatory Visit: Payer: Self-pay

## 2023-12-24 ENCOUNTER — Other Ambulatory Visit (HOSPITAL_COMMUNITY): Payer: Self-pay

## 2023-12-24 MED ORDER — IMIPRAMINE HCL 25 MG PO TABS
ORAL_TABLET | ORAL | 1 refills | Status: DC
  Filled 2023-12-24: qty 120, 33d supply, fill #0

## 2023-12-24 MED ORDER — LISDEXAMFETAMINE DIMESYLATE 50 MG PO CAPS
50.0000 mg | ORAL_CAPSULE | Freq: Every morning | ORAL | 0 refills | Status: DC
  Filled 2023-12-27: qty 30, 30d supply, fill #0

## 2023-12-27 ENCOUNTER — Other Ambulatory Visit (HOSPITAL_COMMUNITY): Payer: Self-pay

## 2023-12-27 ENCOUNTER — Other Ambulatory Visit (HOSPITAL_BASED_OUTPATIENT_CLINIC_OR_DEPARTMENT_OTHER): Payer: Self-pay

## 2024-01-01 ENCOUNTER — Other Ambulatory Visit (HOSPITAL_COMMUNITY): Payer: Self-pay

## 2024-01-13 ENCOUNTER — Other Ambulatory Visit (HOSPITAL_BASED_OUTPATIENT_CLINIC_OR_DEPARTMENT_OTHER): Payer: Self-pay

## 2024-01-16 ENCOUNTER — Other Ambulatory Visit (HOSPITAL_COMMUNITY): Payer: Self-pay

## 2024-01-28 ENCOUNTER — Other Ambulatory Visit (HOSPITAL_COMMUNITY): Payer: Self-pay

## 2024-01-28 MED ORDER — LISDEXAMFETAMINE DIMESYLATE 50 MG PO CAPS
50.0000 mg | ORAL_CAPSULE | Freq: Every morning | ORAL | 0 refills | Status: DC
  Filled 2024-01-28: qty 30, 30d supply, fill #0

## 2024-01-31 ENCOUNTER — Other Ambulatory Visit (HOSPITAL_COMMUNITY): Payer: Self-pay

## 2024-01-31 MED ORDER — CLONAZEPAM 1 MG PO TABS
1.0000 mg | ORAL_TABLET | Freq: Two times a day (BID) | ORAL | 2 refills | Status: AC
  Filled 2024-01-31 – 2024-03-04 (×2): qty 60, 30d supply, fill #0
  Filled 2024-05-07: qty 60, 30d supply, fill #1
  Filled 2024-07-01: qty 60, 30d supply, fill #2

## 2024-01-31 MED ORDER — VILAZODONE HCL 10 MG PO TABS
10.0000 mg | ORAL_TABLET | Freq: Every day | ORAL | 0 refills | Status: DC
  Filled 2024-01-31: qty 7, 7d supply, fill #0

## 2024-01-31 MED ORDER — VILAZODONE HCL 20 MG PO TABS
1.0000 | ORAL_TABLET | Freq: Every day | ORAL | 0 refills | Status: DC
  Filled 2024-01-31: qty 21, 21d supply, fill #0

## 2024-02-07 ENCOUNTER — Other Ambulatory Visit (HOSPITAL_COMMUNITY): Payer: Self-pay

## 2024-02-27 ENCOUNTER — Other Ambulatory Visit (HOSPITAL_COMMUNITY): Payer: Self-pay

## 2024-02-28 ENCOUNTER — Other Ambulatory Visit (HOSPITAL_COMMUNITY): Payer: Self-pay

## 2024-02-28 MED ORDER — LISDEXAMFETAMINE DIMESYLATE 50 MG PO CAPS
50.0000 mg | ORAL_CAPSULE | Freq: Every morning | ORAL | 0 refills | Status: DC
  Filled 2024-02-28: qty 30, 30d supply, fill #0

## 2024-02-28 MED ORDER — AUVELITY 45-105 MG PO TBCR
1.0000 | EXTENDED_RELEASE_TABLET | Freq: Two times a day (BID) | ORAL | 0 refills | Status: AC
  Filled 2024-04-06: qty 180, 90d supply, fill #0

## 2024-02-28 MED ORDER — VILAZODONE HCL 20 MG PO TABS
1.0000 | ORAL_TABLET | Freq: Every day | ORAL | 2 refills | Status: AC
  Filled 2024-02-28: qty 30, 30d supply, fill #0
  Filled 2024-03-25: qty 30, 30d supply, fill #1

## 2024-03-03 DIAGNOSIS — Z860100 Personal history of colon polyps, unspecified: Secondary | ICD-10-CM | POA: Diagnosis not present

## 2024-03-03 DIAGNOSIS — Z1211 Encounter for screening for malignant neoplasm of colon: Secondary | ICD-10-CM | POA: Diagnosis not present

## 2024-03-03 DIAGNOSIS — K5904 Chronic idiopathic constipation: Secondary | ICD-10-CM | POA: Diagnosis not present

## 2024-03-04 ENCOUNTER — Other Ambulatory Visit (HOSPITAL_COMMUNITY): Payer: Self-pay

## 2024-03-11 ENCOUNTER — Other Ambulatory Visit (HOSPITAL_COMMUNITY): Payer: Self-pay

## 2024-03-11 MED ORDER — GOLYTELY 236 G PO SOLR
4000.0000 mL | Freq: Once | ORAL | 0 refills | Status: DC
  Filled 2024-03-11: qty 4000, 1d supply, fill #0

## 2024-03-12 NOTE — Progress Notes (Unsigned)
 No chief complaint on file.   Sheila Obrien is a 53 y.o. female who presents for a complete physical.    Depression and anxiety:   Last year she reported seeing Dr. Lonni Cain ([facility] in [place], virtual visits), had been seeing him for about a year.  Lots of medication changes had been made, and at that time she was onTrintellix, Auvelity  and Rexulti . He had done genetic testing. She was sleeping well, anxiety and depression wasn't great, was considering IOP. She saw Dr. Jess in April.  Still seeing Dr. Cain. Current meds Auvelity  45-105, vyvanse  50, vilazodone  20, clonazepam  1mg  BID, clomipramine  100 mg qHS  ***UPDATE  Vitamin D  deficiency: Last level was normal at 41.1 in 01/2022. This was when she was having 5 [employer]avia fuelings daily, no separate D3.   She is currently still taking 5 fuelings daily.  Component Ref Range & Units (hover) 2 yr ago 4 yr ago 5 yr ago 6 yr ago 7 yr ago 12 yr ago  Vit D, 25-Hydroxy 41.1 38.2 CM 39.2 CM 23.7 Low  CM 22 Low  R, CM 51 R, C    H/o constipation and bloating.  Bloating had been worse with use of artificial sweeteners.   She had colonoscopy in 01/2021 (after 2 failed attempts due to poor prep).  There were multiple sessile serrated adenoma/polyps.  3 year follow-up was recommended.  She recently saw Dr. Kristie and colonoscopy is planned.  She tries to follow a high fiber diet and drink plenty of water.  She no longer has any bloating or constipation.  No longer needs to use Miralax .  Bowels are normal. Denies any blood or mucus in the stool. ***UPDATE  She sees a separate GYN for her hormones, in [place], [place], and she denies hot flashes or night sweats.  Denies postmenopausal bleeding.  ***   Immunization History  Administered Date(s) Administered   Hepatitis B 01/07/1996, 02/12/1996, 11/19/1996   Influenza Split 03/18/2013, 03/12/2014, 03/13/2016   Influenza-Unspecified 03/18/2021, 04/09/2022   PFIZER(Purple  Top)SARS-COV-2 Vaccination 06/06/2019, 06/25/2019, 05/06/2020   Pfizer(Comirnaty)Fall Seasonal Vaccine 12 years and older 03/07/2023   Td 06/18/1998   Tdap 04/11/2009, 01/21/2019   Zoster Recombinant(Shingrix) 02/24/2021, 05/10/2021   She gets flu shots annually through work. Last Pap smear:  03/2023 Dr. Horacio. Last mammogram: 07/2023 at [facility] Last colonoscopy: 01/2021 Dr. Kristie, multiple SSP's.  Repeat rec in 3 years She saw Dr. Kristie earlier this month ***SCHEDULED? Last DEXA: never  Dentist: twice yearly  Ophtho: Yearly; wears contacts and glasses.  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  Exercise:   goes to the [facility] 3 days/week--HIIT, body pump and Barre classes. Sometimes also does a mindfulness class.   Lipids: Lab Results  Component Value Date   CHOL 191 03/07/2023   HDL 75 03/07/2023   LDLCALC 104 (H) 03/07/2023   TRIG 62 03/07/2023   CHOLHDL 2.5 03/07/2023    PMH, PSH, SH and FH were reviewed and updated   ROS: The patient denies anorexia, fever, vision changes, ear pain, sore throat, chest pain, palpitations, dizziness, syncope, dyspnea on exertion, cough, swelling, diarrhea, abdominal pain, melena, hematochezia, indigestion/heartburn, hematuria, incontinence, dysuria, vaginal discharge, or odor, genital lesions, numbness, tingling, weakness, tremor, suspicious skin lesions, abnormal bleeding/bruising, or enlarged lymph nodes.   Chronic hearing loss L ear since childhood . Denies any change or tinnitus. Some urinary urgency, mild. Denies frequency, dysuria, hematuria. Depression and anxiety per HPI.   PHYSICAL EXAM:  There were no vitals taken for this visit.  Wt Readings from Last 3 Encounters:  03/07/23 131 lb (59.4 kg)  02/15/22 132 lb 3.2 oz (60 kg)  08/09/21 139 lb (63 kg)    General Appearance:  Alert, cooperative, no distress, appears stated age   Head:  Normocephalic, without obvious abnormality, atraumatic   Eyes:  PERRL, conjunctiva/corneas  clear, EOM's intact, fundi benign   Ears:  Normal TM's and external ear canals   Nose:  No drainage or sinus tenderness  Throat:  Normal, no lesions  Neck:  Supple, no lymphadenopathy; thyroid : no enlargement/ tenderness/nodules; no carotid bruit or JVD. WHSS right anterior neck  Back:  Spine nontender, no curvature, ROM normal, no CVA tenderness   Lungs:  Clear to auscultation bilaterally without wheezes, rales or ronchi; respirations unlabored   Chest Wall:  No tenderness or deformity. Some prominence of R [place] joint (since childhood, per pt, unchanged)   Heart:  Regular rate and rhythm, S1 and S2 normal, no murmur, rub or gallop   Breast Exam:  Deferred to GYN  Abdomen:  Soft, non-tender, nondistended, normoactive bowel sounds, no masses, no hepatosplenomegaly   Genitalia:  Deferred to GYN        Extremities:  No clubbing, cyanosis or edema.    Pulses:  2+ and symmetric all extremities   Skin:  Skin color, texture, turgor normal, no lesions. Some hyperpigmentation/actinic changes to skin on her legs and arms, unchanged   Lymph nodes:  Cervical, supraclavicular and inguinal nodes normal   Neurologic:  Normal strength, sensation and gait; reflexes 2-3+ and symmetric throughout                              Psych:  Mildly depressed mood, somewhat flat affect; normal hygiene and grooming.    ***UPDATE prominence of R , hyperpigment/actinic changes to extremities ***      03/07/2023    8:39 AM 02/15/2022    8:37 AM 02/06/2021    8:49 AM 02/03/2020    8:46 AM 01/21/2019    8:52 AM  Depression screen PHQ 2/9  Decreased Interest 2 3 0 0 0  Down, Depressed, Hopeless 3 3 0 0 0  PHQ - 2 Score 5 6 0 0 0  Altered sleeping 0 1 0 0   Tired, decreased energy 0 2 0 0   Change in appetite 0 1 0 0   Feeling bad or failure about yourself  1 1 0 0   Trouble concentrating 1 1 0 0   Moving slowly or fidgety/restless 0 1 0 0   Suicidal thoughts 0 0 0 0   PHQ-9 Score 7 13 0 0   Difficult doing  work/chores Somewhat difficult Somewhat difficult Not difficult at all        ASSESSMENT/PLAN:  I saw she saw Dr. Lockie colonoscopy scheduled?  D if change in supplements. Likely doesn't need lipids or TSH unless pt requests them   Flu, prevnar COVID  Discussed monthly self breast exams and yearly mammograms; at least 30 minutes of aerobic activity at least 5 days/week, weight-bearing exercise at least 2x/week; proper sunscreen use reviewed; healthy diet, including goals of calcium and vitamin D  intake and alcohol recommendations (less than or equal to 1 drink/day) reviewed; regular seatbelt use; changing batteries in smoke detectors.  Immunization recommendations discussed--continue yearly flu shots. *** Prevnar-20 *** COVID booster Colonoscopy recommendations reviewed, UTD, due now.  F/u 1  year, sooner prn.

## 2024-03-14 NOTE — Patient Instructions (Incomplete)
  HEALTH MAINTENANCE RECOMMENDATIONS:  It is recommended that you get at least 30 minutes of aerobic exercise at least 5 days/week (for weight loss, you may need as much as 60-90 minutes). This can be any activity that gets your heart rate up. This can be divided in 10-15 minute intervals if needed, but try and build up your endurance at least once a week.  Weight bearing exercise is also recommended twice weekly.  Eat a healthy diet with lots of vegetables, fruits and fiber.  Colorful foods have a lot of vitamins (ie green vegetables, tomatoes, red peppers, etc).  Limit sweet tea, regular sodas and alcoholic beverages, all of which has a lot of calories and sugar.  Up to 1 alcoholic drink daily may be beneficial for women (unless trying to lose weight, watch sugars).  Drink a lot of water.  Calcium recommendations are 1200-1500 mg daily (1500 mg for postmenopausal women or women without ovaries), and vitamin D  1000 IU daily.  This should be obtained from diet and/or supplements (vitamins), and calcium should not be taken all at once, but in divided doses.  Monthly self breast exams and yearly mammograms for women over the age of 31 is recommended.  Sunscreen of at least SPF 30 should be used on all sun-exposed parts of the skin when outside between the hours of 10 am and 4 pm (not just when at beach or pool, but even with exercise, golf, tennis, and yard work!)  Use a sunscreen that says broad spectrum so it covers both UVA and UVB rays, and make sure to reapply every 1-2 hours.  Remember to change the batteries in your smoke detectors when changing your clock times in the spring and fall. Carbon monoxide detectors are recommended for your home.  Use your seat belt every time you are in a car, and please drive safely and not be distracted with cell phones and texting while driving.

## 2024-03-16 ENCOUNTER — Ambulatory Visit: Payer: 59 | Admitting: Family Medicine

## 2024-03-16 ENCOUNTER — Encounter: Payer: Self-pay | Admitting: Family Medicine

## 2024-03-16 VITALS — BP 110/60 | HR 68 | Ht 64.0 in | Wt 127.4 lb

## 2024-03-16 DIAGNOSIS — Z0000 Encounter for general adult medical examination without abnormal findings: Secondary | ICD-10-CM | POA: Diagnosis not present

## 2024-03-16 DIAGNOSIS — E559 Vitamin D deficiency, unspecified: Secondary | ICD-10-CM

## 2024-03-16 DIAGNOSIS — F411 Generalized anxiety disorder: Secondary | ICD-10-CM | POA: Diagnosis not present

## 2024-03-16 DIAGNOSIS — L659 Nonscarring hair loss, unspecified: Secondary | ICD-10-CM

## 2024-03-16 DIAGNOSIS — N939 Abnormal uterine and vaginal bleeding, unspecified: Secondary | ICD-10-CM

## 2024-03-16 DIAGNOSIS — Z23 Encounter for immunization: Secondary | ICD-10-CM

## 2024-03-16 DIAGNOSIS — F339 Major depressive disorder, recurrent, unspecified: Secondary | ICD-10-CM

## 2024-03-17 ENCOUNTER — Ambulatory Visit: Payer: Self-pay | Admitting: Family Medicine

## 2024-03-17 LAB — CBC WITH DIFFERENTIAL/PLATELET
Basophils Absolute: 0.1 x10E3/uL (ref 0.0–0.2)
Basos: 1 %
EOS (ABSOLUTE): 0 x10E3/uL (ref 0.0–0.4)
Eos: 0 %
Hematocrit: 45.2 % (ref 34.0–46.6)
Hemoglobin: 14.5 g/dL (ref 11.1–15.9)
Immature Grans (Abs): 0 x10E3/uL (ref 0.0–0.1)
Immature Granulocytes: 0 %
Lymphocytes Absolute: 1 x10E3/uL (ref 0.7–3.1)
Lymphs: 20 %
MCH: 32.3 pg (ref 26.6–33.0)
MCHC: 32.1 g/dL (ref 31.5–35.7)
MCV: 101 fL — ABNORMAL HIGH (ref 79–97)
Monocytes Absolute: 0.4 x10E3/uL (ref 0.1–0.9)
Monocytes: 8 %
Neutrophils Absolute: 3.5 x10E3/uL (ref 1.4–7.0)
Neutrophils: 71 %
Platelets: 251 x10E3/uL (ref 150–450)
RBC: 4.49 x10E6/uL (ref 3.77–5.28)
RDW: 12 % (ref 11.7–15.4)
WBC: 5 x10E3/uL (ref 3.4–10.8)

## 2024-03-17 LAB — COMPREHENSIVE METABOLIC PANEL WITH GFR
ALT: 37 IU/L — ABNORMAL HIGH (ref 0–32)
AST: 27 IU/L (ref 0–40)
Albumin: 4.4 g/dL (ref 3.8–4.9)
Alkaline Phosphatase: 62 IU/L (ref 49–135)
BUN/Creatinine Ratio: 19 (ref 9–23)
BUN: 15 mg/dL (ref 6–24)
Bilirubin Total: 0.4 mg/dL (ref 0.0–1.2)
CO2: 22 mmol/L (ref 20–29)
Calcium: 9.2 mg/dL (ref 8.7–10.2)
Chloride: 104 mmol/L (ref 96–106)
Creatinine, Ser: 0.8 mg/dL (ref 0.57–1.00)
Globulin, Total: 2.4 g/dL (ref 1.5–4.5)
Glucose: 91 mg/dL (ref 70–99)
Potassium: 4.6 mmol/L (ref 3.5–5.2)
Sodium: 138 mmol/L (ref 134–144)
Total Protein: 6.8 g/dL (ref 6.0–8.5)
eGFR: 88 mL/min/1.73 (ref >59)

## 2024-03-17 LAB — VITAMIN D 25 HYDROXY (VIT D DEFICIENCY, FRACTURES): Vit D, 25-Hydroxy: 30.8 ng/mL (ref 30.0–100.0)

## 2024-03-17 LAB — LIPID PANEL
Chol/HDL Ratio: 2.2 ratio (ref 0.0–4.4)
Cholesterol, Total: 195 mg/dL (ref 100–199)
HDL: 87 mg/dL (ref >39)
LDL Chol Calc (NIH): 98 mg/dL (ref 0–99)
Triglycerides: 55 mg/dL (ref 0–149)
VLDL Cholesterol Cal: 10 mg/dL (ref 5–40)

## 2024-03-17 LAB — TSH: TSH: 1.54 u[IU]/mL (ref 0.450–4.500)

## 2024-03-25 ENCOUNTER — Other Ambulatory Visit (HOSPITAL_COMMUNITY): Payer: Self-pay

## 2024-03-25 DIAGNOSIS — Z1211 Encounter for screening for malignant neoplasm of colon: Secondary | ICD-10-CM | POA: Diagnosis not present

## 2024-03-25 DIAGNOSIS — D122 Benign neoplasm of ascending colon: Secondary | ICD-10-CM | POA: Diagnosis not present

## 2024-03-25 DIAGNOSIS — Z860100 Personal history of colon polyps, unspecified: Secondary | ICD-10-CM | POA: Diagnosis not present

## 2024-03-25 DIAGNOSIS — Z09 Encounter for follow-up examination after completed treatment for conditions other than malignant neoplasm: Secondary | ICD-10-CM | POA: Diagnosis not present

## 2024-03-25 DIAGNOSIS — K635 Polyp of colon: Secondary | ICD-10-CM | POA: Diagnosis not present

## 2024-03-25 LAB — HM COLONOSCOPY

## 2024-03-30 ENCOUNTER — Other Ambulatory Visit (HOSPITAL_COMMUNITY): Payer: Self-pay

## 2024-03-30 MED ORDER — LISDEXAMFETAMINE DIMESYLATE 50 MG PO CAPS
50.0000 mg | ORAL_CAPSULE | Freq: Every morning | ORAL | 0 refills | Status: AC
  Filled 2024-03-30: qty 30, 30d supply, fill #0

## 2024-04-06 ENCOUNTER — Other Ambulatory Visit (HOSPITAL_COMMUNITY): Payer: Self-pay

## 2024-04-06 MED ORDER — AUVELITY 45-105 MG PO TBCR
1.0000 | EXTENDED_RELEASE_TABLET | Freq: Two times a day (BID) | ORAL | 0 refills | Status: AC
  Filled 2024-04-06: qty 180, 90d supply, fill #0

## 2024-04-06 MED ORDER — LISDEXAMFETAMINE DIMESYLATE 50 MG PO CAPS
50.0000 mg | ORAL_CAPSULE | Freq: Every morning | ORAL | 0 refills | Status: AC
  Filled 2024-04-29: qty 30, 30d supply, fill #0

## 2024-04-06 MED ORDER — VILAZODONE HCL 10 MG PO TABS
10.0000 mg | ORAL_TABLET | Freq: Every day | ORAL | 2 refills | Status: AC
  Filled 2024-04-06: qty 30, 30d supply, fill #0
  Filled 2024-05-04 (×2): qty 30, 30d supply, fill #1

## 2024-04-14 ENCOUNTER — Other Ambulatory Visit (HOSPITAL_COMMUNITY): Payer: Self-pay

## 2024-04-14 DIAGNOSIS — N898 Other specified noninflammatory disorders of vagina: Secondary | ICD-10-CM | POA: Diagnosis not present

## 2024-04-14 DIAGNOSIS — Z1389 Encounter for screening for other disorder: Secondary | ICD-10-CM | POA: Diagnosis not present

## 2024-04-14 DIAGNOSIS — N951 Menopausal and female climacteric states: Secondary | ICD-10-CM | POA: Diagnosis not present

## 2024-04-14 DIAGNOSIS — Z01419 Encounter for gynecological examination (general) (routine) without abnormal findings: Secondary | ICD-10-CM | POA: Diagnosis not present

## 2024-04-14 DIAGNOSIS — B9689 Other specified bacterial agents as the cause of diseases classified elsewhere: Secondary | ICD-10-CM | POA: Diagnosis not present

## 2024-04-14 DIAGNOSIS — N939 Abnormal uterine and vaginal bleeding, unspecified: Secondary | ICD-10-CM | POA: Diagnosis not present

## 2024-04-14 DIAGNOSIS — R3915 Urgency of urination: Secondary | ICD-10-CM | POA: Diagnosis not present

## 2024-04-14 DIAGNOSIS — N760 Acute vaginitis: Secondary | ICD-10-CM | POA: Diagnosis not present

## 2024-04-14 MED ORDER — METRONIDAZOLE 500 MG PO TABS
500.0000 mg | ORAL_TABLET | Freq: Two times a day (BID) | ORAL | 0 refills | Status: AC
  Filled 2024-04-14: qty 14, 7d supply, fill #0

## 2024-04-15 ENCOUNTER — Other Ambulatory Visit (HOSPITAL_COMMUNITY): Payer: Self-pay

## 2024-04-15 MED ORDER — PROGESTERONE 200 MG PO CAPS
200.0000 mg | ORAL_CAPSULE | Freq: Every day | ORAL | 4 refills | Status: AC
Start: 2024-04-15 — End: ?
  Filled 2024-04-15: qty 90, 90d supply, fill #0

## 2024-04-22 ENCOUNTER — Ambulatory Visit: Admitting: Family Medicine

## 2024-04-22 ENCOUNTER — Other Ambulatory Visit (HOSPITAL_COMMUNITY): Payer: Self-pay

## 2024-04-22 ENCOUNTER — Telehealth: Payer: Self-pay | Admitting: *Deleted

## 2024-04-22 VITALS — BP 112/78 | HR 80 | Temp 98.3°F | Ht 64.0 in | Wt 129.0 lb

## 2024-04-22 DIAGNOSIS — N939 Abnormal uterine and vaginal bleeding, unspecified: Secondary | ICD-10-CM | POA: Diagnosis not present

## 2024-04-22 DIAGNOSIS — L309 Dermatitis, unspecified: Secondary | ICD-10-CM

## 2024-04-22 MED ORDER — TRIAMCINOLONE ACETONIDE 0.1 % EX CREA
TOPICAL_CREAM | CUTANEOUS | 1 refills | Status: AC
  Filled 2024-04-22: qty 45, 30d supply, fill #0

## 2024-04-22 NOTE — Progress Notes (Signed)
 Chief Complaint  Patient presents with   Eczema    Having eczema flare on her arms. Can't get into derm. Also wanted to let you know that she is having vaginal u/s through Dr. Horacio tomorrow.    Patient has a h/o eczema since age 53. Stress and weather changes trigger it. Hasn't needed to use prescription medications in years.  Eczema is currently flaring on both arms (front of elbow, extending into the upper arms) over the past week. It is very itchy. She is requesting prescription cream.  Review of chart--previously treated with TAC 0.1%, filled in 2013 and last in 2019 (both prescribed by me).    She saw GYN last week for annual exam.  She is on HRT and having abnormal vaginal bleeding.  She is schedule for vaginal ultrasound. She reports that she is currently taking flagyl for BV.   PMH, PSH, SH reviewed  Outpatient Encounter Medications as of 04/22/2024  Medication Sig Note   clonazePAM  (KLONOPIN ) 1 MG tablet Take 1 tablet (1 mg total) by mouth 2 (two) times daily. 04/22/2024: Takes once daily   Dextromethorphan -buPROPion  ER (AUVELITY ) 45-105 MG TBCR Take 1 tablet by mouth 2 (two) times daily.    estradiol  (VIVELLE -DOT) 0.05 MG/24HR patch Place 1 patch (0.05 mg total) onto the skin 2 (two) times a week.    lisdexamfetamine (VYVANSE ) 50 MG capsule Take 1 capsule (50 mg total) by mouth every morning.    metroNIDAZOLE (FLAGYL) 500 MG tablet Take 1 tablet (500 mg total) by mouth 2 (two) times daily for 7 days.    Multiple Vitamins-Minerals (MULTIVITAMIN WOMEN 50+ PO) Take 1 tablet by mouth daily. 03/16/2024: Started 3 weeks ago and is taking 1-2 times a week   progesterone  (PROMETRIUM ) 200 MG capsule Take 1 capsule (200 mg total) by mouth daily.    Vilazodone  HCl (VIIBRYD ) 10 MG TABS Take 1 tablet by mouth daily with food with Vilazodone  20 mg    Vilazodone  HCl 20 MG TABS Take 1 tablet (20 mg total) by mouth daily with food.    Dextromethorphan -buPROPion  ER (AUVELITY ) 45-105 MG TBCR  Take 1 tablet by mouth 2 (two) times daily. (Patient not taking: Reported on 04/22/2024)    [START ON 04/29/2024] lisdexamfetamine (VYVANSE ) 50 MG capsule Take 1 capsule (50 mg total) by mouth every morning. (11/12) (Patient not taking: Reported on 04/22/2024)    [DISCONTINUED] escitalopram  (LEXAPRO ) 5 MG tablet Take 1 tablet (5 mg total) by mouth daily for 1 week after 10 mg finished    [DISCONTINUED] lisdexamfetamine (VYVANSE ) 50 MG capsule Take 1 capsule (50 mg total) by mouth every morning. (Patient not taking: Reported on 03/16/2024)    [DISCONTINUED] lisdexamfetamine (VYVANSE ) 50 MG capsule Take 1 capsule (50 mg total) by mouth every morning. (Patient not taking: Reported on 03/16/2024)    [DISCONTINUED] venlafaxine  XR (EFFEXOR  XR) 75 MG 24 hr capsule Take 1 capsule (75 mg total) by mouth daily for 5 days, THEN 2 capsules (150 mg total) daily    No facility-administered encounter medications on file as of 04/22/2024.   Allergies  Allergen Reactions   Penicillins Hives    ROS: no f/c, URI symptoms, CP, SOB. Intermittent vaginal spotting, being evaluated by GYN. Eczema flare per HPI. Denies other concerns.    PHYSICAL EXAM:  BP 112/78   Pulse 80   Temp 98.3 F (36.8 C) (Tympanic)   Ht 5' 4 (1.626 m)   Wt 129 lb (58.5 kg)   LMP 04/01/2024 (Approximate)   BMI  22.14 kg/m   Pleasant, well-appearing female in no distress Skin: Very dry skin on both arms (upper and lower) Areas of erythema and papules at antecubital fossa, extending slightly to upper arm (mainly just dry). Neuro: she is alert and oriented, cranial nerves grossly intact, normal gait. Psych: she appears to be in good spirits today, full range of affect.    ASSESSMENT/PLAN:  Eczema, unspecified type - reviewed proper use of TAC, importance of keeping skin well moisturized - Plan: triamcinolone  cream (KENALOG ) 0.1 %  Abnormal uterine bleeding (AUB) - on HRT, scheduled for pelvic US  through OB-GYN  Moisturize at  least 2-3 times daily. Avoid long hot showers or baths. Use the prescription steroid cream sparingly, just to the affected areas of skin, twice daily until better (for up to 2 weeks). If it is significantly better, but still a little itchy, you might want to step down to an over-the-counter hydrocortisone (the prescription is too strong to use on normal-appearing skin).  Consider taking an antihistamine such as benadryl, or a longer-acting one such as zyrtec, if needed for itching (use the benadryl at night).

## 2024-04-22 NOTE — Telephone Encounter (Signed)
 Copied from CRM [id number]. Topic: Clinical - Medication Question >> Apr 22, 2024  8:49 AM Gustabo D wrote: Eczema is out of control on her arms and wants to know if she can get a cream with a steroid in it to help with immflamation and itching . She use to see [facility] but it hasn't flared up in years and she says it will take months to get a appt. Pt wants a phone call  Patient scheduled for today at 3:45pm.

## 2024-04-22 NOTE — Patient Instructions (Signed)
 Moisturize at least 2-3 times daily. Avoid long hot showers or baths. Use the prescription steroid cream sparingly, just to the affected areas of skin, twice daily until better (for up to 2 weeks). If it is significantly better, but still a little itchy, you might want to step down to an over-the-counter hydrocortisone (the prescription is too strong to use on normal-appearing skin).  Consider taking an antihistamine such as benadryl, or a longer-acting one such as zyrtec, if needed for itching (use the benadryl at night).

## 2024-04-23 DIAGNOSIS — N939 Abnormal uterine and vaginal bleeding, unspecified: Secondary | ICD-10-CM | POA: Diagnosis not present

## 2024-04-27 ENCOUNTER — Other Ambulatory Visit (HOSPITAL_COMMUNITY): Payer: Self-pay

## 2024-04-29 ENCOUNTER — Other Ambulatory Visit (HOSPITAL_COMMUNITY): Payer: Self-pay

## 2024-04-29 ENCOUNTER — Other Ambulatory Visit: Payer: Self-pay

## 2024-05-04 ENCOUNTER — Other Ambulatory Visit (HOSPITAL_COMMUNITY): Payer: Self-pay

## 2024-05-07 ENCOUNTER — Other Ambulatory Visit (HOSPITAL_COMMUNITY): Payer: Self-pay

## 2024-05-08 ENCOUNTER — Other Ambulatory Visit (HOSPITAL_COMMUNITY): Payer: Self-pay

## 2024-05-08 MED ORDER — LISDEXAMFETAMINE DIMESYLATE 50 MG PO CAPS
50.0000 mg | ORAL_CAPSULE | Freq: Every morning | ORAL | 0 refills | Status: AC
  Filled 2024-05-29: qty 30, 30d supply, fill #0

## 2024-05-08 MED ORDER — VILAZODONE HCL 40 MG PO TABS
40.0000 mg | ORAL_TABLET | Freq: Every day | ORAL | 2 refills | Status: AC
  Filled 2024-05-08: qty 30, 30d supply, fill #0

## 2024-05-27 ENCOUNTER — Other Ambulatory Visit (HOSPITAL_COMMUNITY): Payer: Self-pay

## 2024-05-27 MED ORDER — ESTRADIOL 0.05 MG/24HR TD PTTW
1.0000 | MEDICATED_PATCH | TRANSDERMAL | 3 refills | Status: AC
  Filled 2024-05-27: qty 24, 84d supply, fill #0

## 2024-05-29 ENCOUNTER — Other Ambulatory Visit (HOSPITAL_COMMUNITY): Payer: Self-pay

## 2024-06-05 ENCOUNTER — Other Ambulatory Visit (HOSPITAL_COMMUNITY): Payer: Self-pay

## 2024-06-05 MED ORDER — LISDEXAMFETAMINE DIMESYLATE 50 MG PO CAPS: 50.0000 mg | ORAL_CAPSULE | Freq: Every morning | ORAL | 0 refills | Status: DC

## 2024-06-05 MED ORDER — LAMOTRIGINE 100 MG PO TABS
100.0000 mg | ORAL_TABLET | Freq: Every day | ORAL | 2 refills | Status: AC
  Filled 2024-06-05: qty 30, 30d supply, fill #0

## 2024-06-05 MED ORDER — LAMOTRIGINE 25 MG PO TABS
ORAL_TABLET | ORAL | 2 refills | Status: AC
  Filled 2024-06-05: qty 35, 21d supply, fill #0

## 2024-06-05 MED ORDER — VILAZODONE HCL 40 MG PO TABS
40.0000 mg | ORAL_TABLET | Freq: Every day | ORAL | 2 refills | Status: AC
  Filled 2024-06-05: qty 30, 30d supply, fill #0
  Filled 2024-07-10: qty 30, 30d supply, fill #1

## 2024-06-24 ENCOUNTER — Other Ambulatory Visit (HOSPITAL_COMMUNITY): Payer: Self-pay

## 2024-06-24 MED ORDER — LISDEXAMFETAMINE DIMESYLATE 50 MG PO CAPS
50.0000 mg | ORAL_CAPSULE | Freq: Every morning | ORAL | 0 refills | Status: AC
  Filled 2024-06-26 (×2): qty 30, 30d supply, fill #0

## 2024-06-26 ENCOUNTER — Other Ambulatory Visit (HOSPITAL_COMMUNITY): Payer: Self-pay

## 2024-07-01 ENCOUNTER — Other Ambulatory Visit (HOSPITAL_COMMUNITY): Payer: Self-pay

## 2024-07-08 ENCOUNTER — Other Ambulatory Visit: Payer: Self-pay

## 2024-07-08 ENCOUNTER — Other Ambulatory Visit (HOSPITAL_COMMUNITY): Payer: Self-pay

## 2024-07-08 MED ORDER — LAMOTRIGINE 150 MG PO TABS
150.0000 mg | ORAL_TABLET | Freq: Every day | ORAL | 0 refills | Status: AC
  Filled 2024-07-08: qty 15, 15d supply, fill #0

## 2024-07-08 MED ORDER — LAMOTRIGINE 200 MG PO TABS
200.0000 mg | ORAL_TABLET | Freq: Every day | ORAL | 2 refills | Status: AC
  Filled 2024-07-08: qty 30, 30d supply, fill #0

## 2024-07-08 MED ORDER — CLONAZEPAM 1 MG PO TABS: 1.0000 mg | ORAL_TABLET | Freq: Two times a day (BID) | ORAL | 2 refills | Status: AC

## 2024-07-08 MED ORDER — LISDEXAMFETAMINE DIMESYLATE 50 MG PO CAPS: 50.0000 mg | ORAL_CAPSULE | Freq: Every morning | ORAL | 0 refills | Status: AC

## 2024-07-09 ENCOUNTER — Other Ambulatory Visit (HOSPITAL_COMMUNITY): Payer: Self-pay

## 2024-07-10 ENCOUNTER — Other Ambulatory Visit (HOSPITAL_COMMUNITY): Payer: Self-pay

## 2024-07-13 ENCOUNTER — Other Ambulatory Visit (HOSPITAL_COMMUNITY): Payer: Self-pay

## 2025-04-12 ENCOUNTER — Encounter: Payer: Self-pay | Admitting: Family Medicine
